# Patient Record
Sex: Female | Born: 1960 | Race: Black or African American | Hispanic: No | Marital: Single | Smoking: Current some day smoker
Health system: Southern US, Community
[De-identification: ages and names within clinical notes are randomized; demographics above are authoritative.]

## PROBLEM LIST (undated history)

## (undated) DIAGNOSIS — M1990 Unspecified osteoarthritis, unspecified site: Secondary | ICD-10-CM

## (undated) DIAGNOSIS — K219 Gastro-esophageal reflux disease without esophagitis: Secondary | ICD-10-CM

## (undated) DIAGNOSIS — I10 Essential (primary) hypertension: Secondary | ICD-10-CM

---

## 1998-08-15 ENCOUNTER — Other Ambulatory Visit: Admission: RE | Admit: 1998-08-15 | Discharge: 1998-08-15 | Payer: Self-pay | Admitting: Obstetrics

## 1998-09-29 ENCOUNTER — Other Ambulatory Visit: Admission: RE | Admit: 1998-09-29 | Discharge: 1998-09-29 | Payer: Self-pay | Admitting: Obstetrics

## 1999-11-06 ENCOUNTER — Emergency Department (HOSPITAL_COMMUNITY): Admission: EM | Admit: 1999-11-06 | Discharge: 1999-11-06 | Payer: Self-pay | Admitting: Emergency Medicine

## 2000-04-16 ENCOUNTER — Other Ambulatory Visit: Admission: RE | Admit: 2000-04-16 | Discharge: 2000-04-16 | Payer: Self-pay | Admitting: Obstetrics

## 2001-05-14 ENCOUNTER — Other Ambulatory Visit: Admission: RE | Admit: 2001-05-14 | Discharge: 2001-05-14 | Payer: Self-pay | Admitting: Obstetrics

## 2003-06-16 ENCOUNTER — Ambulatory Visit (HOSPITAL_COMMUNITY): Admission: RE | Admit: 2003-06-16 | Discharge: 2003-06-16 | Payer: Self-pay | Admitting: Obstetrics

## 2003-06-16 ENCOUNTER — Encounter: Payer: Self-pay | Admitting: Obstetrics

## 2006-09-19 ENCOUNTER — Ambulatory Visit (HOSPITAL_COMMUNITY): Admission: RE | Admit: 2006-09-19 | Discharge: 2006-09-19 | Payer: Self-pay | Admitting: Obstetrics

## 2007-10-24 ENCOUNTER — Ambulatory Visit (HOSPITAL_COMMUNITY): Admission: RE | Admit: 2007-10-24 | Discharge: 2007-10-24 | Payer: Self-pay | Admitting: Obstetrics

## 2010-09-27 ENCOUNTER — Ambulatory Visit (HOSPITAL_COMMUNITY)
Admission: RE | Admit: 2010-09-27 | Discharge: 2010-09-27 | Payer: Self-pay | Source: Home / Self Care | Admitting: Internal Medicine

## 2011-01-07 ENCOUNTER — Encounter: Payer: Self-pay | Admitting: Obstetrics

## 2011-02-17 ENCOUNTER — Emergency Department (HOSPITAL_COMMUNITY)
Admission: EM | Admit: 2011-02-17 | Discharge: 2011-02-17 | Disposition: A | Discharge status: Home / Self Care | Payer: Medicaid Other | Attending: Emergency Medicine | Admitting: Emergency Medicine

## 2011-02-17 DIAGNOSIS — K219 Gastro-esophageal reflux disease without esophagitis: Secondary | ICD-10-CM | POA: Insufficient documentation

## 2011-02-17 DIAGNOSIS — M79609 Pain in unspecified limb: Secondary | ICD-10-CM | POA: Insufficient documentation

## 2011-02-17 DIAGNOSIS — L0201 Cutaneous abscess of face: Secondary | ICD-10-CM | POA: Insufficient documentation

## 2011-02-17 DIAGNOSIS — R229 Localized swelling, mass and lump, unspecified: Secondary | ICD-10-CM | POA: Insufficient documentation

## 2011-02-17 DIAGNOSIS — E7800 Pure hypercholesterolemia, unspecified: Secondary | ICD-10-CM | POA: Insufficient documentation

## 2011-02-17 DIAGNOSIS — I10 Essential (primary) hypertension: Secondary | ICD-10-CM | POA: Insufficient documentation

## 2011-02-17 DIAGNOSIS — L03211 Cellulitis of face: Secondary | ICD-10-CM | POA: Insufficient documentation

## 2011-06-23 ENCOUNTER — Emergency Department (HOSPITAL_COMMUNITY): Payer: Self-pay

## 2011-06-23 ENCOUNTER — Emergency Department (HOSPITAL_COMMUNITY)
Admission: EM | Admit: 2011-06-23 | Discharge: 2011-06-23 | Disposition: A | Discharge status: Home / Self Care | Payer: Self-pay | Attending: Emergency Medicine | Admitting: Emergency Medicine

## 2011-06-23 DIAGNOSIS — R071 Chest pain on breathing: Secondary | ICD-10-CM | POA: Insufficient documentation

## 2011-06-23 DIAGNOSIS — M129 Arthropathy, unspecified: Secondary | ICD-10-CM | POA: Insufficient documentation

## 2011-06-23 DIAGNOSIS — Z79899 Other long term (current) drug therapy: Secondary | ICD-10-CM | POA: Insufficient documentation

## 2011-06-23 DIAGNOSIS — R0602 Shortness of breath: Secondary | ICD-10-CM | POA: Insufficient documentation

## 2011-06-23 DIAGNOSIS — I10 Essential (primary) hypertension: Secondary | ICD-10-CM | POA: Insufficient documentation

## 2011-06-23 DIAGNOSIS — F17200 Nicotine dependence, unspecified, uncomplicated: Secondary | ICD-10-CM | POA: Insufficient documentation

## 2011-06-23 DIAGNOSIS — K219 Gastro-esophageal reflux disease without esophagitis: Secondary | ICD-10-CM | POA: Insufficient documentation

## 2011-06-23 LAB — CBC
HCT: 34.8 % — ABNORMAL LOW (ref 36.0–46.0)
Hemoglobin: 11.8 g/dL — ABNORMAL LOW (ref 12.0–15.0)
MCH: 26.3 pg (ref 26.0–34.0)
MCHC: 33.9 g/dL (ref 30.0–36.0)
MCV: 77.7 fL — ABNORMAL LOW (ref 78.0–100.0)
RBC: 4.48 MIL/uL (ref 3.87–5.11)
RDW: 14.7 % (ref 11.5–15.5)

## 2011-06-23 LAB — URINALYSIS, ROUTINE W REFLEX MICROSCOPIC
Bilirubin Urine: NEGATIVE
Glucose, UA: NEGATIVE mg/dL
Hgb urine dipstick: NEGATIVE
Ketones, ur: NEGATIVE mg/dL
Leukocytes, UA: NEGATIVE
Nitrite: NEGATIVE
Protein, ur: NEGATIVE mg/dL
Specific Gravity, Urine: 1.008 (ref 1.005–1.030)
Urobilinogen, UA: 0.2 mg/dL (ref 0.0–1.0)
pH: 6 (ref 5.0–8.0)

## 2011-06-23 LAB — CK TOTAL AND CKMB (NOT AT ARMC)
CK, MB: 2.2 ng/mL (ref 0.3–4.0)
Relative Index: INVALID (ref 0.0–2.5)
Total CK: 69 U/L (ref 7–177)

## 2011-06-23 LAB — TROPONIN I: Troponin I: <0.3 ng/mL (ref <0.30)

## 2011-06-23 LAB — BASIC METABOLIC PANEL
BUN: 11 mg/dL (ref 6–23)
Calcium: 9.5 mg/dL (ref 8.4–10.5)
Chloride: 100 mEq/L (ref 96–112)
Creatinine, Ser: 0.54 mg/dL (ref 0.50–1.10)
GFR calc Af Amer: >60 mL/min (ref >60)
GFR calc non Af Amer: >60 mL/min (ref >60)
Glucose, Bld: 93 mg/dL (ref 70–99)
Potassium: 3.3 mEq/L — ABNORMAL LOW (ref 3.5–5.1)
Sodium: 135 mEq/L (ref 135–145)

## 2011-06-23 LAB — DIFFERENTIAL
Basophils Absolute: 0 10*3/uL (ref 0.0–0.1)
Basophils Relative: 0 % (ref 0–1)
Eosinophils Relative: 5 % (ref 0–5)
Lymphocytes Relative: 30 % (ref 12–46)
Lymphs Abs: 2.1 10*3/uL (ref 0.7–4.0)
Monocytes Absolute: 0.4 10*3/uL (ref 0.1–1.0)
Monocytes Relative: 6 % (ref 3–12)
Neutro Abs: 4.2 10*3/uL (ref 1.7–7.7)

## 2011-12-13 ENCOUNTER — Emergency Department (HOSPITAL_COMMUNITY)
Admission: EM | Admit: 2011-12-13 | Discharge: 2011-12-13 | Disposition: A | Discharge status: Home / Self Care | Payer: Self-pay | Attending: Emergency Medicine | Admitting: Emergency Medicine

## 2011-12-13 ENCOUNTER — Emergency Department (HOSPITAL_COMMUNITY): Payer: Self-pay

## 2011-12-13 ENCOUNTER — Encounter: Payer: Self-pay | Admitting: *Deleted

## 2011-12-13 DIAGNOSIS — Z79899 Other long term (current) drug therapy: Secondary | ICD-10-CM | POA: Insufficient documentation

## 2011-12-13 DIAGNOSIS — M25469 Effusion, unspecified knee: Secondary | ICD-10-CM | POA: Insufficient documentation

## 2011-12-13 DIAGNOSIS — M1710 Unilateral primary osteoarthritis, unspecified knee: Secondary | ICD-10-CM | POA: Insufficient documentation

## 2011-12-13 DIAGNOSIS — M129 Arthropathy, unspecified: Secondary | ICD-10-CM | POA: Insufficient documentation

## 2011-12-13 DIAGNOSIS — M79609 Pain in unspecified limb: Secondary | ICD-10-CM | POA: Insufficient documentation

## 2011-12-13 DIAGNOSIS — I10 Essential (primary) hypertension: Secondary | ICD-10-CM | POA: Insufficient documentation

## 2011-12-13 DIAGNOSIS — M1990 Unspecified osteoarthritis, unspecified site: Secondary | ICD-10-CM

## 2011-12-13 DIAGNOSIS — M25569 Pain in unspecified knee: Secondary | ICD-10-CM | POA: Insufficient documentation

## 2011-12-13 HISTORY — DX: Essential (primary) hypertension: I10

## 2011-12-13 HISTORY — DX: Unspecified osteoarthritis, unspecified site: M19.90

## 2011-12-13 LAB — D-DIMER, QUANTITATIVE: D-Dimer, Quant: 0.48 ug/mL-FEU (ref 0.00–0.48)

## 2011-12-13 LAB — BASIC METABOLIC PANEL
CO2: 30 mEq/L (ref 19–32)
Calcium: 9.8 mg/dL (ref 8.4–10.5)
Creatinine, Ser: 0.57 mg/dL (ref 0.50–1.10)
GFR calc Af Amer: >90 mL/min (ref >90)
GFR calc non Af Amer: >90 mL/min (ref >90)

## 2011-12-13 MED ORDER — OXYCODONE-ACETAMINOPHEN 5-325 MG PO TABS: 2.0000 | ORAL_TABLET | ORAL | Status: AC | PRN

## 2011-12-13 MED ORDER — OXYCODONE-ACETAMINOPHEN 5-325 MG PO TABS
1.0000 | ORAL_TABLET | Freq: Once | ORAL | Status: AC
  Administered 2011-12-13: 1 via ORAL
  Filled 2011-12-13: qty 1

## 2011-12-13 NOTE — ED Notes (Signed)
Pt educated about discharge instructions. Pt informed about follow-up care and prescribed way for pain medication.  Pt stated that she understands discharge care.

## 2011-12-13 NOTE — ED Notes (Signed)
Pt states that rt leg has been swollen x 1 month. Currently pain is 0. Pulses in BLE 2+. Brisk capillary refill WNL. Will continue to monitor.

## 2011-12-13 NOTE — ED Provider Notes (Signed)
History     CSN: [id number]  Arrival date & time 12/13/11  1532   First MD Initiated Contact with Patient 12/13/11 1806      Chief Complaint  Patient presents with  . Leg Pain    (Consider location/radiation/quality/duration/timing/severity/associated sxs/prior treatment) Patient is a 50 y.o. female presenting with leg pain. The history is provided by the patient.  Leg Pain    the patient is a 50 year old morbidly obese female, who complains of increasing pain and swelling in her right knee for several weeks.  She has not fallen.  She denies fevers.  She denies weakness.  She denies chest pain or shortness of breath.  She has not had recent trips or surgeries.  Past Medical History  Diagnosis Date  . Arthritis   . Hypertension     History reviewed. No pertinent past surgical history.  History reviewed. No pertinent family history.  History  Substance Use Topics  . Smoking status: Passive Smoker    Types: Cigarettes  . Smokeless tobacco: Not on file  . Alcohol Use: No    OB History    Grav Para Term Preterm Abortions TAB SAB Ect Mult Living                  Review of Systems  Constitutional: Negative for fever and chills.  Respiratory: Negative for cough, chest tightness and shortness of breath.   Cardiovascular: Negative for chest pain and palpitations.  Gastrointestinal: Negative for nausea and vomiting.  Musculoskeletal:       Right knee pain, and  Skin: Negative for rash.  Neurological: Negative for weakness.  Psychiatric/Behavioral: Negative for confusion.    Allergies  Review of patient's allergies indicates no known allergies.  Home Medications   Current Outpatient Rx  Name Route Sig Dispense Refill  . GOODY HEADACHE PO Oral Take 1 packet by mouth daily as needed. For headache     . CELECOXIB 200 MG PO CAPS Oral Take 200 mg by mouth daily.      Marland Kitchen HYDROCHLOROTHIAZIDE 25 MG PO TABS Oral Take 25 mg by mouth daily.      Marland Kitchen METOPROLOL SUCCINATE ER 50  MG PO TB24 Oral Take 50 mg by mouth daily.      Marland Kitchen OMEPRAZOLE 40 MG PO CPDR Oral Take 40 mg by mouth daily.        BP 137/100  Pulse 71  Temp(Src) 98.1 F (36.7 C) (Oral)  Resp 19  SpO2 100%  Physical Exam  Constitutional: She is oriented to person, place, and time. She appears well-developed and well-nourished.  HENT:  Head: Normocephalic and atraumatic.  Eyes: Pupils are equal, round, and reactive to light.  Neck: Normal range of motion.  Cardiovascular: Normal rate, regular rhythm and normal heart sounds.   No murmur heard. Pulmonary/Chest: Effort normal and breath sounds normal. No respiratory distress. She has no wheezes. She has no rales.  Abdominal: Soft. She exhibits no distension and no mass. There is no tenderness. There is no rebound and no guarding.  Musculoskeletal: Normal range of motion. She exhibits tenderness. She exhibits no edema.       Right knee Positive medial jointline tenderness.  No swelling.  No effusion.  No rashes.  No joint instability  Neurological: She is alert and oriented to person, place, and time. No cranial nerve deficit.  Skin: Skin is warm and dry. No rash noted. No erythema.  Psychiatric: She has a normal mood and affect. Her behavior is normal.  ED Course  Procedures (including critical care time) 50 year old morbidly obese female, with right knee pain, and tenderness, with no history of trauma, and no systemic symptoms.  No chest pain, or shortness of breath.  We will perform an x-ray, and a d-dimer, to look for blood clot, versus osteoarthritis.  Labs Reviewed  BASIC METABOLIC PANEL - Abnormal; Notable for the following:    Potassium 3.1 (*)    All other components within normal limits  D-DIMER, QUANTITATIVE   Dg Knee Complete 4 Views Right  12/13/2011  *RADIOLOGY REPORT*  Clinical Data: Pain and swelling  RIGHT KNEE - COMPLETE 4+ VIEW  Comparison: None  Findings: There is no joint effusion.  There is moderate tricompartment  osteoarthritis.  Changes include sharpening of the tibial spines, marginal spur formation and joint space narrowing.  No fracture or subluxation identified.  IMPRESSION:  1.  No acute findings.  2.  Moderate tricompartment osteoarthritis.  Original Report Authenticated By: Rosealee Albee, M.D.     No diagnosis found.    MDM  osteoarthritis        Nicholes Stairs, MD 12/13/11 Barry Brunner
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
## 2012-03-10 ENCOUNTER — Emergency Department (HOSPITAL_COMMUNITY)
Admission: EM | Admit: 2012-03-10 | Discharge: 2012-03-10 | Disposition: A | Discharge status: Home / Self Care | Payer: No Typology Code available for payment source | Attending: Emergency Medicine | Admitting: Emergency Medicine

## 2012-03-10 ENCOUNTER — Encounter (HOSPITAL_COMMUNITY): Payer: Self-pay | Admitting: Emergency Medicine

## 2012-03-10 DIAGNOSIS — M129 Arthropathy, unspecified: Secondary | ICD-10-CM | POA: Insufficient documentation

## 2012-03-10 DIAGNOSIS — I10 Essential (primary) hypertension: Secondary | ICD-10-CM | POA: Insufficient documentation

## 2012-03-10 DIAGNOSIS — M549 Dorsalgia, unspecified: Secondary | ICD-10-CM | POA: Insufficient documentation

## 2012-03-10 DIAGNOSIS — F17200 Nicotine dependence, unspecified, uncomplicated: Secondary | ICD-10-CM | POA: Insufficient documentation
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
## 2012-03-12 NOTE — ED Provider Notes (Signed)
Medical screening examination/treatment/procedure(s) were performed by non-physician practitioner and as supervising physician I was immediately available for consultation/collaboration.   Rolla Servidio III, MD 03/12/12 1026 

## 2013-06-29 ENCOUNTER — Encounter (HOSPITAL_COMMUNITY): Payer: Self-pay | Admitting: Emergency Medicine

## 2013-06-29 ENCOUNTER — Emergency Department (HOSPITAL_COMMUNITY)
Admission: EM | Admit: 2013-06-29 | Discharge: 2013-06-29 | Disposition: A | Discharge status: Home / Self Care | Payer: Medicaid Other | Attending: Emergency Medicine | Admitting: Emergency Medicine

## 2013-06-29 ENCOUNTER — Emergency Department (HOSPITAL_COMMUNITY): Payer: Medicaid Other

## 2013-06-29 DIAGNOSIS — I10 Essential (primary) hypertension: Secondary | ICD-10-CM | POA: Insufficient documentation

## 2013-06-29 DIAGNOSIS — M129 Arthropathy, unspecified: Secondary | ICD-10-CM | POA: Insufficient documentation

## 2013-06-29 DIAGNOSIS — R110 Nausea: Secondary | ICD-10-CM | POA: Insufficient documentation

## 2013-06-29 DIAGNOSIS — R1909 Other intra-abdominal and pelvic swelling, mass and lump: Secondary | ICD-10-CM | POA: Insufficient documentation

## 2013-06-29 DIAGNOSIS — R109 Unspecified abdominal pain: Secondary | ICD-10-CM | POA: Insufficient documentation

## 2013-06-29 DIAGNOSIS — Z79899 Other long term (current) drug therapy: Secondary | ICD-10-CM | POA: Insufficient documentation

## 2013-06-29 LAB — CBC
HCT: 33.2 % — ABNORMAL LOW (ref 36.0–46.0)
MCH: 24.9 pg — ABNORMAL LOW (ref 26.0–34.0)
MCV: 76 fL — ABNORMAL LOW (ref 78.0–100.0)
Platelets: 327 10*3/uL (ref 150–400)
RBC: 4.37 MIL/uL (ref 3.87–5.11)
RDW: 15.2 % (ref 11.5–15.5)
WBC: 6.8 10*3/uL (ref 4.0–10.5)

## 2013-06-29 LAB — BASIC METABOLIC PANEL
BUN: 10 mg/dL (ref 6–23)
CO2: 30 mEq/L (ref 19–32)
Calcium: 9.3 mg/dL (ref 8.4–10.5)
Chloride: 97 mEq/L (ref 96–112)
GFR calc Af Amer: >90 mL/min (ref >90)
GFR calc non Af Amer: >90 mL/min (ref >90)
Glucose, Bld: 91 mg/dL (ref 70–99)
Potassium: 3.1 mEq/L — ABNORMAL LOW (ref 3.5–5.1)

## 2013-06-29 MED ORDER — IOHEXOL 300 MG/ML  SOLN
25.0000 mL | INTRAMUSCULAR | Status: AC
  Administered 2013-06-29: 25 mL via ORAL

## 2013-06-29 MED ORDER — IOHEXOL 300 MG/ML  SOLN
100.0000 mL | Freq: Once | INTRAMUSCULAR | Status: AC | PRN
  Administered 2013-06-29: 100 mL via INTRAVENOUS

## 2013-06-29 NOTE — ED Notes (Signed)
Pt. Stated, I've had rt. Side pain for a year. It feels hard when i lay on that side.

## 2013-06-29 NOTE — ED Provider Notes (Signed)
History    CSN: [id number] Arrival date & time 06/29/13  1033  First MD Initiated Contact with Patient 06/29/13 1119     Chief Complaint  Patient presents with  . Abdominal Pain   (Consider location/radiation/quality/duration/timing/severity/associated sxs/prior Treatment) HPI Comments: 52 year old morbidly obese female past medical history of hypertension and arthritis presents to the emergency department complaining of a right sided abdominal mass has been present for the past year causing her discomfort. Patient states whenever she lays on her right side she feels something hard, when she turns to lay on the left side she can feel pressure coming from the right. Feels as if her abdomen has been getting larger and states she does not eat that much. Last menstrual period was 3 years ago and has not had any vaginal bleeding since. No history of hysterectomy. She mentioned this problem to her primary care physician who "mashed on my stomach and said I was fine". Admits to occasional nausea without vomiting. Denies urinary or bowel changes. States her on has a history of cancer, unsure what kind, possibly breast cancer.  Patient is a 52 y.o. female presenting with abdominal pain. The history is provided by the patient.  Abdominal Pain Associated symptoms include abdominal pain and nausea. Pertinent negatives include no chest pain, chills, fever or vomiting.   Past Medical History  Diagnosis Date  . Arthritis   . Hypertension    History reviewed. No pertinent past surgical history. No family history on file. History  Substance Use Topics  . Smoking status: Passive Smoke Exposure - Never Smoker    Types: Cigarettes  . Smokeless tobacco: Not on file  . Alcohol Use: No   OB History   Grav Para Term Preterm Abortions TAB SAB Ect Mult Living                 Review of Systems  Constitutional: Negative for fever, chills and appetite change.  Respiratory: Negative for shortness of  breath.   Cardiovascular: Negative for chest pain.  Gastrointestinal: Positive for nausea and abdominal pain. Negative for vomiting.  Genitourinary: Negative for dysuria, urgency, frequency, vaginal bleeding, vaginal discharge, difficulty urinating, vaginal pain and pelvic pain.  Musculoskeletal: Negative for back pain.  All other systems reviewed and are negative.    Allergies  Review of patient's allergies indicates no known allergies.  Home Medications   Current Outpatient Rx  Name  Route  Sig  Dispense  Refill  . Aspirin-Acetaminophen-Caffeine (GOODY HEADACHE PO)   Oral   Take 1 packet by mouth daily as needed. For headache         . celecoxib (CELEBREX) 200 MG capsule   Oral   Take 200 mg by mouth daily as needed for pain.          . hydrochlorothiazide (HYDRODIURIL) 25 MG tablet   Oral   Take 25 mg by mouth daily.           . metoprolol (TOPROL-XL) 50 MG 24 hr tablet   Oral   Take 50 mg by mouth daily.           Marland Kitchen omeprazole (PRILOSEC) 20 MG capsule   Oral   Take 20-40 mg by mouth 2 (two) times daily. Take 40 mg (two capsules) daily, or 20 mg twice a day.         . sertraline (ZOLOFT) 100 MG tablet   Oral   Take 100 mg by mouth daily.         Marland Kitchen  Vitamin D, Ergocalciferol, (DRISDOL) 50000 UNITS CAPS   Oral   Take 50,000 Units by mouth every 7 (seven) days. Take one capsule every Friday.          BP 148/93  Pulse 87  Temp(Src) 97.6 F (36.4 C) (Oral)  Resp 18  Ht 5' 5.5" (1.664 m)  Wt 270 lb (122.471 kg)  BMI 44.23 kg/m2  SpO2 100% Physical Exam  Nursing note and vitals reviewed. Constitutional: She is oriented to person, place, and time. She appears well-developed. No distress.  Morbidly obese.  HENT:  Head: Normocephalic and atraumatic.  Mouth/Throat: Oropharynx is clear and moist.  Eyes: Conjunctivae are normal. No scleral icterus.  Neck: Normal range of motion. Neck supple.  Cardiovascular: Normal rate, regular rhythm and normal  heart sounds.   Pulmonary/Chest: Effort normal and breath sounds normal. No respiratory distress.  Abdominal: Soft. Bowel sounds are normal. There is generalized tenderness (generalized abdominal tenderness, R>L). There is no rigidity, no rebound and no guarding.  Abdominal exam limited by patient's body habitus. No masses palpated. No peritoneal signs.  Musculoskeletal: Normal range of motion. She exhibits no edema.  Neurological: She is alert and oriented to person, place, and time.  Skin: Skin is warm and dry. She is not diaphoretic.  Psychiatric: She has a normal mood and affect. Her behavior is normal.    ED Course  Procedures (including critical care time) Labs Reviewed  CBC - Abnormal; Notable for the following:    Hemoglobin 10.9 (*)    HCT 33.2 (*)    MCV 76.0 (*)    MCH 24.9 (*)    All other components within normal limits  BASIC METABOLIC PANEL - Abnormal; Notable for the following:    Potassium 3.1 (*)    All other components within normal limits   Ct Abdomen Pelvis W Contrast  06/29/2013   *RADIOLOGY REPORT*  Clinical Data: Abdominal pain with bloating for 1-2 years.  CT ABDOMEN AND PELVIS WITH CONTRAST  Technique:  Multidetector CT imaging of the abdomen and pelvis was performed following the standard protocol during bolus administration of intravenous contrast.  Contrast: OMNIPAQUE IOHEXOL 300 MG/ML  SOLN  Comparison: None.  Findings: The lung bases are clear.  There is no pleural effusion.  The liver, gallbladder, biliary system and pancreas appear normal. The spleen, adrenal glands and kidneys appear normal.  The stomach and small bowel appear normal.  There is moderate stool throughout the colon.  Retrocecal appendix is distended to 11 mm, but shows no gross surrounding inflammatory change or focal fluid collection.  There are small adjacent ileocolonic mesenteric lymph nodes.  No enlarged retroperitoneal or mesenteric lymph nodes are identified.  There is no ascites.   The bladder, uterus and ovaries appear normal.  There is no significant atherosclerosis.  Mild degenerative changes are present within the lumbar spine, right hip and symphysis pubis.  There are no acute osseous findings.  IMPRESSION:  1.  Prominence of the appendix is of uncertain significance/etiology in this patient with reported longstanding pain and no leukocytosis.  Clinical correlation necessary to exclude atypical appendicitis. 2.  No evidence of bowel obstruction or abscess. 3.  No significant visceral organ findings.   Original Report Authenticated By: Carey Bullocks, M.D.   1. Abdominal pain     MDM  Patient with right sided "mass" and discomfort. PE limited by patient's body habitus, generalized tenderness of abdomen, moreso R. Will obtain CT abd/pelvis to r/o any masses causing her pain. Still  with uterus/ovaries. She is in NAD. Evaluated by PCP and he did not look into the issue. Case discussed with Dr. Hyacinth Meeker who also evaluated patient and agrees with plan of care. 3:00 PM CT scan without any abnormality. No findings concerning patient's symptoms. She will f/u with PCP. Tylenol/motrin for pain. Return precautions discussed. Patient states understanding of plan and is agreeable.   Trevor Mace, PA-C 06/29/13 1501

## 2013-06-29 NOTE — ED Provider Notes (Signed)
53 year old obese female with 9-12 months of right-sided abdominal pain and right flank pain. This has been getting worse recently, she was evaluated by her physician on and ordered at this point as the patient has not been nauseated or vomiting, no changes in her stool habits, no weight loss. On my exam she is morbidly obese, she has reproducible tenderness to palpation across her right side and flank as well as right upper quadrant.  CT scan shows no acute findings, unremarkable workup, patient stable for discharge.  Medical screening examination/treatment/procedure(s) were conducted as a shared visit with non-physician practitioner(s) and myself.  I personally evaluated the patient during the encounter    Vida Roller, MD 06/29/13 2010

## 2013-06-29 NOTE — ED Notes (Signed)
Patient transported to CT 

## 2013-06-29 NOTE — ED Notes (Signed)
Pt reports she feels "a knot" to right upper abdomen x 1 year. States notices it when lying on side. Pain to palpate. Saw PCP last week, states he did not think much of it. Nausea at times, no vomiting. No other symptoms. Denies pain at present.

## 2013-08-05 ENCOUNTER — Other Ambulatory Visit (HOSPITAL_COMMUNITY): Payer: Self-pay | Admitting: Obstetrics

## 2013-08-05 DIAGNOSIS — Z1231 Encounter for screening mammogram for malignant neoplasm of breast: Secondary | ICD-10-CM

## 2013-08-14 ENCOUNTER — Ambulatory Visit (HOSPITAL_COMMUNITY)
Admission: RE | Admit: 2013-08-14 | Discharge: 2013-08-14 | Disposition: A | Discharge status: Home / Self Care | Payer: Medicaid Other | Source: Ambulatory Visit | Attending: Obstetrics | Admitting: Obstetrics

## 2013-08-14 ENCOUNTER — Ambulatory Visit (HOSPITAL_COMMUNITY): Payer: No Typology Code available for payment source

## 2013-08-14 DIAGNOSIS — Z1231 Encounter for screening mammogram for malignant neoplasm of breast: Secondary | ICD-10-CM | POA: Insufficient documentation

## 2013-12-30 ENCOUNTER — Encounter (HOSPITAL_COMMUNITY): Payer: Self-pay | Admitting: Emergency Medicine

## 2013-12-30 ENCOUNTER — Emergency Department (HOSPITAL_COMMUNITY)
Admission: EM | Admit: 2013-12-30 | Discharge: 2013-12-30 | Disposition: A | Discharge status: Home / Self Care | Payer: Medicaid Other | Source: Home / Self Care | Attending: Emergency Medicine | Admitting: Emergency Medicine

## 2013-12-30 DIAGNOSIS — K620 Anal polyp: Secondary | ICD-10-CM

## 2013-12-30 DIAGNOSIS — K921 Melena: Secondary | ICD-10-CM

## 2013-12-30 DIAGNOSIS — K621 Rectal polyp: Secondary | ICD-10-CM

## 2013-12-30 DIAGNOSIS — E876 Hypokalemia: Secondary | ICD-10-CM

## 2013-12-30 LAB — POCT I-STAT, CHEM 8
BUN: 8 mg/dL (ref 6–23)
CREATININE: 0.7 mg/dL (ref 0.50–1.10)
Calcium, Ion: 1.27 mmol/L — ABNORMAL HIGH (ref 1.12–1.23)
Chloride: 96 mEq/L (ref 96–112)
GLUCOSE: 97 mg/dL (ref 70–99)
HCT: 38 % (ref 36.0–46.0)
HEMOGLOBIN: 12.9 g/dL (ref 12.0–15.0)
Potassium: 3.3 mEq/L — ABNORMAL LOW (ref 3.7–5.3)
Sodium: 140 mEq/L (ref 137–147)
TCO2: 30 mmol/L (ref 0–100)

## 2013-12-30 NOTE — ED Provider Notes (Signed)
Chief Complaint:   Chief Complaint  Patient presents with  . Rectal Bleeding    History of Present Illness:    Drue NovelDarlena Macdonald is a 53 year old female who has had a 7-8 day history of bright red rectal bleeding with a bowel movement. She notes blood on the toilet tissue and in the toilet bowl. The blood is not mixed with the stool or coating the stool. She denies any constipation or diarrhea. There is no rectal pain or itching. She's had some lower abdominal pain. She's felt a little lightheaded and weak. She's never had a colonoscopy.  Review of Systems:  Other than noted above, the patient denies any of the following symptoms: Constitutional:  No fever, chills, fatigue, weight loss or anorexia. Lungs:  No cough or shortness of breath. Heart:  No chest pain, palpitations, syncope or edema.  No cardiac history. Abdomen:  No nausea, vomiting, hematememesis, melena, constipation, or diarrhea. GU:  No dysuria, frequency, urgency, or hematuria.   Skin:  No rash or itching.  PMFSH:  Past medical history, family history, social history, meds, and allergies were reviewed along with nurse's notes.  No prior abdominal surgeries or history of GI problems.  No use of NSAIDs or aspirin.  No excessive  alcohol intake.  She has hypertension, arthritis, and GERD. She takes Celebrex, omeprazole, and metoprolol.  Physical Exam:   Vital signs:  BP 140/82  Pulse 73  Temp(Src) 97.7 F (36.5 C) (Oral)  Resp 16  SpO2 100% Gen:  Alert, oriented, in no distress. Lungs:  Breath sounds clear and equal bilaterally.  No wheezes, rales or rhonchi. Heart:  Regular rhythm.  No gallops or murmers.   Abdomen:  Soft, nontender, no organomegaly or masses, bowel sounds are normal. Skin:  Clear, warm and dry.  No rash.  Procedure Note:  Verbal informed consent was obtained from the patient.  Risks and benefits were outlined with the patient.  Patient understands and accepts these risks.  Identity of the patient was  confirmed verbally and by armband.    Procedure was performed as followed:  External exam reveals a small anal polyp protruding externally. This was friable and bled when touched. It was nontender to palpation. Anoscopic examination did not reveal any other anal lesions, hemorrhoids, or fissures. The polyp was approximately 1 cm in size. Digital rectal exam reveals no masses, no tenderness, and heme positive stool.  Patient tolerated the procedure well without any immediate complications.  Labs:   Results for orders placed during the hospital encounter of 12/30/13  POCT I-STAT, CHEM 8      Result Value Range   Sodium 140  137 - 147 mEq/L   Potassium 3.3 (*) 3.7 - 5.3 mEq/L   Chloride 96  96 - 112 mEq/L   BUN 8  6 - 23 mg/dL   Creatinine, Ser 1.610.70  0.50 - 1.10 mg/dL   Glucose, Bld 97  70 - 99 mg/dL   Calcium, Ion 0.961.27 (*) 1.12 - 1.23 mmol/L   TCO2 30  0 - 100 mmol/L   Hemoglobin 12.9  12.0 - 15.0 g/dL   HCT 04.538.0  40.936.0 - 81.146.0 %     Assessment:  The primary encounter diagnosis was Rectal polyp. Diagnoses of Hematochezia and Hypokalemia were also pertinent to this visit.  She'll need a followup with gastroenterology as soon as possible to have this removed.  Plan:   1.  Meds:  The following meds were prescribed:   Discharge Medication List as of  12/30/2013  4:26 PM      2.  Patient Education/Counseling:  The patient was given appropriate handouts, self care instructions, and instructed in symptomatic relief.  Advised sitz baths, stool softeners, use of baby wipes for cleansing.  3.  Follow up:  The patient was told to follow up if no better in 3 to 4 days, if becoming worse in any way, and given some red flag symptoms such as massive bleeding or worsening abdominal pain which would prompt immediate return.  Follow up with Dr. Laban Emperor as soon as possible.      Reuben Likes, MD 12/30/13 2049

## 2013-12-30 NOTE — ED Notes (Signed)
At bedside for rectal exam by dr Lorenz Coasterkeller

## 2013-12-30 NOTE — ED Notes (Signed)
Blood in stool, visible for 7 days per patient.  Reports bright blood in stool.  Reports hard stool sometimes.  Reports blood has been thicker some days than other days.  Blood occurs with bowel movements.

## 2013-12-30 NOTE — Discharge Instructions (Signed)
Use stool softener (Docusate 100 mg daily), take a sit down tub bath daily, use baby wipes for cleansing.      Potassium Content of Foods Potassium is a mineral found in many foods and drinks. It helps keep fluids and minerals balanced in your body and also affects how steadily your heart beats. The body needs potassium to control blood pressure and to keep the muscles and nervous system healthy. However, certain health conditions and medicine may require you to eat more or less potassium-rich foods and drinks. Your caregiver or dietitian will tell you how much potassium you should have each day. COMMON SERVING SIZES The list below tells you how big or small common portion sizes are:  1 oz.........4 stacked dice.  3 oz........Marland KitchenDeck of cards.  1 tsp.......Marland KitchenTip of little finger.  1 tbsp....Marland KitchenMarland KitchenThumb.  2 tbsp....Marland KitchenMarland KitchenGolf ball.   c..........Marland KitchenHalf of a fist.  1 c...........Marland KitchenA fist. FOODS AND DRINKS HIGH IN POTASSIUM More than 200 mg of potassium per serving. A serving size is  c (120 mL or noted gram weight) unless otherwise stated. While all the items on this list are high in potassium, some items are higher in potassium than others. Fruits  Apricots (sliced), 83 g.  Apricots (dried halves), 3 oz / 24 g.  Avocado (cubed),  c / 50 g.  Banana (sliced), 75 g.  Cantaloupe (cubed), 80 g.  Dates (pitted), 5 whole / 35 g.  Figs (dried), 4 whole / 32 g.  Guava, c / 55 g.  Honeydew, 1 wedge / 85 g.  Kiwi (sliced), 90 g.  Nectarine, 1 small / 129 g.  Orange, 1 medium / 131 g.  Orange juice.  Pomegranate seeds, 87 g.  Pomegranate juice.  Prunes (pitted), 3 whole / 30 g.  Prune juice, 3 oz / 90 mL.  Seedless raisins, 3 tbsp / 27 g. Vegetables  Artichoke,  of a medium / 64 g.  Asparagus (boiled), 90 g.  Baked beans,  c / 63 g.  Bamboo shoots,  c / 38 g.  Beets (cooked slices), 85 g.  Broccoli (boiled), 78 g.  Brussels sprout (boiled), 78 g.  Butternut  squash (baked), 103 g.  Chickpea (cooked), 82 g.  Green peas (cooked), 80 g.  Hubbard squash (baked cubes),  c / 68 g.  Kidney beans (cooked), 5 tbsp / 55 g.  Lima beans (cooked),  c / 43 g.  Navy beans (cooked),  c / 61 g.  Potato (baked), 61 g.  Potato (boiled), 78 g.  Pumpkin (boiled), 123 g.  Refried beans,  c / 79 g.  Spinach (cooked),  c / 45 g.  Split peas (cooked),  c / 65 g.  Sun-dried tomatoes, 2 tbsp / 7 g.  Sweet potato (baked),  c / 50 g.  Tomato (chopped or sliced), 90 g.  Tomato juice.  Tomato paste, 4 tsp / 21 g.  Tomato sauce,  c / 61 g.  Vegetable juice.  White mushrooms (cooked), 78 g.  Yam (cooked or baked),  c / 34 g.  Zucchini squash (boiled), 90 g. Other Foods and Drinks  Almonds (whole),  c / 36 g.  Cashews (oil roasted),  c / 32 g.  Chocolate milk.  Chocolate pudding, 142 g.  Clams (steamed), 1.5 oz / 43 g.  Dark chocolate, 1.5 oz / 42 g.  Fish, 3 oz / 85 g.  King crab (steamed), 3 oz / 85 g.  Lobster (steamed), 4 oz / 113 g.  Milk (skim, 1%, 2%, whole), 1 c / 240 mL.  Milk chocolate, 2.3 oz / 66 g.  Milk shake.  Nonfat fruit variety yogurt, 123 g.  Peanuts (oil roasted), 1 oz / 28 g.  Peanut butter, 2 tbsp / 32 g.  Pistachio nuts, 1 oz / 28 g.  Pumpkin seeds, 1 oz / 28 g.  Red meat (broiled, cooked, grilled), 3 oz / 85 g.  Scallops (steamed), 3 oz / 85 g.  Shredded wheat cereal (dry), 3 oblong biscuits / 75 g.  Spaghetti sauce,  c / 66 g.  Sunflower seeds (dry roasted), 1 oz / 28 g.  Veggie burger, 1 patty / 70 g. FOODS MODERATE IN POTASSIUM Between 150 mg and 200 mg per serving. A serving is  c (120 mL or noted gram weight) unless otherwise stated. Fruits  Grapefruit,  of the fruit / 123 g.  Grapefruit juice.  Pineapple juice.  Plums (sliced), 83 g.  Tangerine, 1 large / 120 g. Vegetables  Carrots (boiled), 78 g.  Carrots (sliced), 61 g.  Rhubarb (cooked with sugar), 120  g.  Rutabaga (cooked), 120 g.  Sweet corn (cooked), 75 g.  Yellow snap beans (cooked), 63 g. Other Foods and Drinks   Bagel, 1 bagel / 98 g.  Chicken breast (roasted and chopped),  c / 70 g.  Chocolate ice cream / 66 g.  Pita bread, 1 large / 64 g.  Shrimp (steamed), 4 oz / 113 g.  Swiss cheese (diced), 70 g.  Vanilla ice cream, 66 g.  Vanilla pudding, 140 g. FOODS LOW IN POTASSIUM Less than 150 mg per serving. A serving size is  cup (120 mL or noted gram weight) unless otherwise stated. If you eat more than 1 serving of a food low in potassium, the food may be considered a food high in potassium. Fruits  Apple (slices), 55 g.  Apple juice.  Applesauce, 122 g.  Blackberries, 72 g.  Blueberries, 74 g.  Cranberries, 50 g.  Cranberry juice.  Fruit cocktail, 119 g.  Fruit punch.  Grapes, 46 g.  Grape juice.  Mandarin oranges (canned), 126 g.  Peach (slices), 77 g.  Pineapple (chunks), 83 g.  Raspberries, 62 g.  Red cherries (without pits), 78 g.  Strawberries (sliced), 83 g.  Watermelon (diced), 76 g. Vegetables  Alfalfa sprouts, 17 g.  Bell peppers (sliced), 46 g.  Cabbage (shredded), 35 g.  Cauliflower (boiled), 62 g.  Celery, 51 g.  Collard greens (boiled), 95 g.  Cucumber (sliced), 52 g.  Eggplant (cubed), 41 g.  Green beans (boiled), 63 g.  Lettuce (shredded), 1 c / 36 g.  Onions (sauteed), 44 g.  Radishes (sliced), 58 g.  Spaghetti squash, 51 g. Other Foods and Drinks  [employer] food cake, 1 slice / 28 g.  Black tea.  Brown rice (cooked), 98 g.  Butter croissant, 1 medium / 57 g.  Carbonated soda.  Coffee.  Cheddar cheese (diced), 66 g.  Corn flake cereal (dry), 14 g.  Cottage cheese, 118 g.  Cream of rice cereal (cooked), 122 g.  Cream of wheat cereal (cooked), 126 g.  Crisped rice cereal (dry), 14 g.  Egg (boiled, fried, poached, omelet, scrambled), 1 large / 46 61 g.  English muffin, 1 muffin / 57  g.  Frozen ice pop, 1 pop / 55 g.  Graham cracker, 1 large rectangular cracker / 14 g.  Jelly beans, 112 g.  Non-dairy whipped topping.  Oatmeal, 88 g.  Orange sherbet, 74 g.  Puffed rice cereal (dry), 7 g.  Pasta (cooked), 70 g.  Rice cakes, 4 cakes / 36 g.  Sugared doughnut, 4 oz / 116 g.  White bread, 1 slice / 30 g.  White rice (cooked), 79 93 g.  Wild rice (cooked), 82 g.  Yellow cake, 1 slice / 68 g. Document Released: 07/17/2005 Document Revised: 11/19/2012 Document Reviewed: 04/18/2012 [facility] Patient Information 2014 [place], [place].

## 2013-12-31 LAB — OCCULT BLOOD, POC DEVICE: Fecal Occult Bld: POSITIVE — AB

## 2014-02-12 ENCOUNTER — Emergency Department (HOSPITAL_COMMUNITY): Payer: Medicaid Other

## 2014-02-12 ENCOUNTER — Emergency Department (HOSPITAL_COMMUNITY)
Admission: EM | Admit: 2014-02-12 | Discharge: 2014-02-13 | Disposition: A | Discharge status: Home / Self Care | Payer: Medicaid Other | Attending: Emergency Medicine | Admitting: Emergency Medicine

## 2014-02-12 ENCOUNTER — Encounter (HOSPITAL_COMMUNITY): Payer: Self-pay | Admitting: Emergency Medicine

## 2014-02-12 DIAGNOSIS — R112 Nausea with vomiting, unspecified: Secondary | ICD-10-CM | POA: Insufficient documentation

## 2014-02-12 DIAGNOSIS — Z8719 Personal history of other diseases of the digestive system: Secondary | ICD-10-CM | POA: Insufficient documentation

## 2014-02-12 DIAGNOSIS — R109 Unspecified abdominal pain: Secondary | ICD-10-CM | POA: Insufficient documentation

## 2014-02-12 DIAGNOSIS — R197 Diarrhea, unspecified: Secondary | ICD-10-CM | POA: Insufficient documentation

## 2014-02-12 DIAGNOSIS — M129 Arthropathy, unspecified: Secondary | ICD-10-CM | POA: Insufficient documentation

## 2014-02-12 DIAGNOSIS — E876 Hypokalemia: Secondary | ICD-10-CM

## 2014-02-12 DIAGNOSIS — E871 Hypo-osmolality and hyponatremia: Secondary | ICD-10-CM | POA: Insufficient documentation

## 2014-02-12 DIAGNOSIS — Z79899 Other long term (current) drug therapy: Secondary | ICD-10-CM | POA: Insufficient documentation

## 2014-02-12 DIAGNOSIS — I10 Essential (primary) hypertension: Secondary | ICD-10-CM | POA: Insufficient documentation

## 2014-02-12 LAB — CBC WITH DIFFERENTIAL/PLATELET
BASOS ABS: 0 10*3/uL (ref 0.0–0.1)
Basophils Relative: 0 % (ref 0–1)
EOS ABS: 0 10*3/uL (ref 0.0–0.7)
EOS PCT: 0 % (ref 0–5)
HCT: 35.1 % — ABNORMAL LOW (ref 36.0–46.0)
Hemoglobin: 11.4 g/dL — ABNORMAL LOW (ref 12.0–15.0)
LYMPHS ABS: 1.5 10*3/uL (ref 0.7–4.0)
Lymphocytes Relative: 28 % (ref 12–46)
MCH: 24.4 pg — AB (ref 26.0–34.0)
MCHC: 32.5 g/dL (ref 30.0–36.0)
MCV: 75 fL — AB (ref 78.0–100.0)
Monocytes Absolute: 0.4 10*3/uL (ref 0.1–1.0)
Monocytes Relative: 9 % (ref 3–12)
Neutro Abs: 3.2 10*3/uL (ref 1.7–7.7)
Neutrophils Relative %: 63 % (ref 43–77)
PLATELETS: 305 10*3/uL (ref 150–400)
RBC: 4.68 MIL/uL (ref 3.87–5.11)
RDW: 14.9 % (ref 11.5–15.5)
WBC: 5.1 10*3/uL (ref 4.0–10.5)

## 2014-02-12 LAB — COMPREHENSIVE METABOLIC PANEL
ALT: 20 U/L (ref 0–35)
AST: 26 U/L (ref 0–37)
Albumin: 3.7 g/dL (ref 3.5–5.2)
Alkaline Phosphatase: 106 U/L (ref 39–117)
BUN: 10 mg/dL (ref 6–23)
CALCIUM: 9.6 mg/dL (ref 8.4–10.5)
CO2: 26 mEq/L (ref 19–32)
Chloride: 89 mEq/L — ABNORMAL LOW (ref 96–112)
Creatinine, Ser: 0.67 mg/dL (ref 0.50–1.10)
GFR calc non Af Amer: >90 mL/min (ref >90)
GLUCOSE: 80 mg/dL (ref 70–99)
Potassium: 3 mEq/L — ABNORMAL LOW (ref 3.7–5.3)
SODIUM: 130 meq/L — AB (ref 137–147)
TOTAL PROTEIN: 8.5 g/dL — AB (ref 6.0–8.3)
Total Bilirubin: 0.4 mg/dL (ref 0.3–1.2)

## 2014-02-12 LAB — LIPASE, BLOOD: Lipase: 21 U/L (ref 11–59)

## 2014-02-12 LAB — URINALYSIS, ROUTINE W REFLEX MICROSCOPIC
Bilirubin Urine: NEGATIVE
Glucose, UA: NEGATIVE mg/dL
HGB URINE DIPSTICK: NEGATIVE
Ketones, ur: NEGATIVE mg/dL
LEUKOCYTES UA: NEGATIVE
NITRITE: NEGATIVE
Protein, ur: NEGATIVE mg/dL
SPECIFIC GRAVITY, URINE: 1.009 (ref 1.005–1.030)
UROBILINOGEN UA: 0.2 mg/dL (ref 0.0–1.0)
pH: 6.5 (ref 5.0–8.0)

## 2014-02-12 MED ORDER — MORPHINE SULFATE 4 MG/ML IJ SOLN
4.0000 mg | Freq: Once | INTRAMUSCULAR | Status: AC
  Administered 2014-02-12: 4 mg via INTRAVENOUS
  Filled 2014-02-12: qty 1

## 2014-02-12 MED ORDER — SODIUM CHLORIDE 0.9 % IV SOLN
1000.0000 mL | Freq: Once | INTRAVENOUS | Status: AC
  Administered 2014-02-12: 1000 mL via INTRAVENOUS

## 2014-02-12 MED ORDER — SODIUM CHLORIDE 0.9 % IV SOLN: 1000.0000 mL | INTRAVENOUS | Status: DC

## 2014-02-12 MED ORDER — ONDANSETRON 8 MG PO TBDP
8.0000 mg | ORAL_TABLET | Freq: Once | ORAL | Status: AC
  Administered 2014-02-12: 8 mg via ORAL
  Filled 2014-02-12: qty 1

## 2014-02-12 MED ORDER — ONDANSETRON HCL 4 MG/2ML IJ SOLN
4.0000 mg | Freq: Once | INTRAMUSCULAR | Status: AC
  Administered 2014-02-12: 4 mg via INTRAVENOUS
  Filled 2014-02-12: qty 2

## 2014-02-12 MED ORDER — IOHEXOL 300 MG/ML  SOLN
50.0000 mL | Freq: Once | INTRAMUSCULAR | Status: AC | PRN
  Administered 2014-02-12: 50 mL via ORAL

## 2014-02-12 MED ORDER — POTASSIUM CHLORIDE 10 MEQ/100ML IV SOLN
10.0000 meq | Freq: Once | INTRAVENOUS | Status: AC
  Administered 2014-02-12: 10 meq via INTRAVENOUS
  Filled 2014-02-12: qty 100

## 2014-02-12 NOTE — ED Notes (Signed)
Pt reports centralized abdominal pain that began on Monday. Pt reports nausea, emesis, and diarrhea since Monday. Pt denies abdominal surgeries or history. Pt is A/O x4, in NAD, and vitals are WDL.

## 2014-02-12 NOTE — ED Provider Notes (Signed)
CSN: [id number]     Arrival date & time 02/12/14  1804 History   First MD Initiated Contact with Patient 02/12/14 2251     Chief Complaint  Patient presents with  . Abdominal Pain  . Emesis  . Diarrhea     (Consider location/radiation/quality/duration/timing/severity/associated sxs/prior Treatment) Patient is a 53 y.o. female presenting with abdominal pain, vomiting, and diarrhea. The history is provided by the patient.  Abdominal Pain Associated symptoms: diarrhea and vomiting   Emesis Associated symptoms: abdominal pain and diarrhea   Diarrhea Associated symptoms: abdominal pain and vomiting   She has been sick for the past 5 days with crampy abdominal pain, nausea, vomiting, diarrhea. Symptoms are worse when she tries to eat. Abdominal pain is improved slightly following vomiting but recurs quickly. Pain is severe and she rates it 10/10. There has been subjective fever but no chills or sweats. She denies any blood in stool or emesis. She has not tried anything to treat her symptoms. She denies sick contacts. Of note, she was diagnosed with a rectal polyp at an urgent care visit last month and is supposed to be scheduled for a colonoscopy but has not actually been scheduled.  Past Medical History  Diagnosis Date  . Arthritis   . Hypertension    History reviewed. No pertinent past surgical history. No family history on file. History  Substance Use Topics  . Smoking status: Passive Smoke Exposure - Never Smoker    Types: Cigarettes  . Smokeless tobacco: Not on file  . Alcohol Use: No   OB History   Grav Para Term Preterm Abortions TAB SAB Ect Mult Living                 Review of Systems  Gastrointestinal: Positive for vomiting, abdominal pain and diarrhea.  All other systems reviewed and are negative.      Allergies  Review of patient's allergies indicates no known allergies.  Home Medications   Current Outpatient Rx  Name  Route  Sig  Dispense  Refill  .  celecoxib (CELEBREX) 200 MG capsule   Oral   Take 200 mg by mouth daily as needed for mild pain.          . hydrochlorothiazide (HYDRODIURIL) 25 MG tablet   Oral   Take 25 mg by mouth every evening.          Marland Kitchen HYDROcodone-acetaminophen (NORCO/VICODIN) 5-325 MG per tablet   Oral   Take 1 tablet by mouth every 6 (six) hours as needed for moderate pain.         . metoprolol (TOPROL-XL) 50 MG 24 hr tablet   Oral   Take 50 mg by mouth every evening.          Marland Kitchen omeprazole (PRILOSEC) 20 MG capsule   Oral   Take 20 mg by mouth 2 (two) times daily.          . sertraline (ZOLOFT) 100 MG tablet   Oral   Take 100 mg by mouth every evening.           BP 147/79  Pulse 77  Temp(Src) 98.1 F (36.7 C) (Oral)  Resp 18  SpO2 99% Physical Exam  Nursing note and vitals reviewed.  53 year old female, resting comfortably and in no acute distress. Vital signs are significant for hypertension with blood pressure 147/79. Oxygen saturation is 99%, which is normal. Head is normocephalic and atraumatic. PERRLA, EOMI. Oropharynx is clear. Neck is nontender and supple  without adenopathy or JVD. Back is nontender and there is no CVA tenderness. Lungs are clear without rales, wheezes, or rhonchi. Chest is nontender. Heart has regular rate and rhythm without murmur. Abdomen is soft, flat, with mild tenderness diffusely. There is no rebound or guarding. There are no masses or hepatosplenomegaly and peristalsis is hypoactive. Extremities have no cyanosis or edema, full range of motion is present. Skin is warm and dry without rash. Neurologic: Mental status is normal, cranial nerves are intact, there are no motor or sensory deficits.  ED Course  Procedures (including critical care time) Labs Review Results for orders placed during the hospital encounter of 02/12/14  CBC WITH DIFFERENTIAL      Result Value Ref Range   WBC 5.1  4.0 - 10.5 K/uL   RBC 4.68  3.87 - 5.11 MIL/uL   Hemoglobin  11.4 (*) 12.0 - 15.0 g/dL   HCT 40.9 (*) 81.1 - 91.4 %   MCV 75.0 (*) 78.0 - 100.0 fL   MCH 24.4 (*) 26.0 - 34.0 pg   MCHC 32.5  30.0 - 36.0 g/dL   RDW 78.2  95.6 - 21.3 %   Platelets 305  150 - 400 K/uL   Neutrophils Relative % 63  43 - 77 %   Neutro Abs 3.2  1.7 - 7.7 K/uL   Lymphocytes Relative 28  12 - 46 %   Lymphs Abs 1.5  0.7 - 4.0 K/uL   Monocytes Relative 9  3 - 12 %   Monocytes Absolute 0.4  0.1 - 1.0 K/uL   Eosinophils Relative 0  0 - 5 %   Eosinophils Absolute 0.0  0.0 - 0.7 K/uL   Basophils Relative 0  0 - 1 %   Basophils Absolute 0.0  0.0 - 0.1 K/uL  COMPREHENSIVE METABOLIC PANEL      Result Value Ref Range   Sodium 130 (*) 137 - 147 mEq/L   Potassium 3.0 (*) 3.7 - 5.3 mEq/L   Chloride 89 (*) 96 - 112 mEq/L   CO2 26  19 - 32 mEq/L   Glucose, Bld 80  70 - 99 mg/dL   BUN 10  6 - 23 mg/dL   Creatinine, Ser 0.86  0.50 - 1.10 mg/dL   Calcium 9.6  8.4 - 57.8 mg/dL   Total Protein 8.5 (*) 6.0 - 8.3 g/dL   Albumin 3.7  3.5 - 5.2 g/dL   AST 26  0 - 37 U/L   ALT 20  0 - 35 U/L   Alkaline Phosphatase 106  39 - 117 U/L   Total Bilirubin 0.4  0.3 - 1.2 mg/dL   GFR calc non Af Amer >90  >90 mL/min   GFR calc Af Amer >90  >90 mL/min  LIPASE, BLOOD      Result Value Ref Range   Lipase 21  11 - 59 U/L  URINALYSIS, ROUTINE W REFLEX MICROSCOPIC      Result Value Ref Range   Color, Urine YELLOW  YELLOW   APPearance CLEAR  CLEAR   Specific Gravity, Urine 1.009  1.005 - 1.030   pH 6.5  5.0 - 8.0   Glucose, UA NEGATIVE  NEGATIVE mg/dL   Hgb urine dipstick NEGATIVE  NEGATIVE   Bilirubin Urine NEGATIVE  NEGATIVE   Ketones, ur NEGATIVE  NEGATIVE mg/dL   Protein, ur NEGATIVE  NEGATIVE mg/dL   Urobilinogen, UA 0.2  0.0 - 1.0 mg/dL   Nitrite NEGATIVE  NEGATIVE   Leukocytes, UA NEGATIVE  NEGATIVE   Imaging Review Ct Abdomen Pelvis W Contrast  02/13/2014   CLINICAL DATA:  Mid abdominal pain, nausea, vomiting and diarrhea.  EXAM: CT ABDOMEN AND PELVIS WITH CONTRAST  TECHNIQUE:  Multidetector CT imaging of the abdomen and pelvis was performed using the standard protocol following bolus administration of intravenous contrast.  CONTRAST:  50mL OMNIPAQUE IOHEXOL 300 MG/ML SOLN, 100mL OMNIPAQUE IOHEXOL 300 MG/ML SOLN  COMPARISON:  CT of the abdomen and pelvis performed 06/29/2013  FINDINGS: The visualized lung bases are clear.  The liver and spleen are unremarkable in appearance. The gallbladder is within normal limits. The pancreas and adrenal glands are unremarkable.  The kidneys are unremarkable in appearance. There is no evidence of hydronephrosis. No renal or ureteral stones are seen. No perinephric stranding is appreciated.  No free fluid is identified. The small bowel is unremarkable in appearance. The stomach is within normal limits. No acute vascular abnormalities are seen.  The appendix is grossly unremarkable in appearance, though difficult to fully assess. There is no definite evidence of appendicitis. Mild diverticulosis is noted about the cecum, and additional mild diverticulosis is seen along the distal descending and sigmoid colon. There is no definite evidence of diverticulitis.  The bladder is mildly distended and grossly unremarkable in appearance. The uterus is within normal limits. The ovaries are relatively symmetric; no suspicious adnexal masses are seen. No inguinal lymphadenopathy is seen.  No acute osseous abnormalities are identified.  IMPRESSION: 1. No acute abnormality seen within the abdomen or pelvis. 2. Mild diverticulosis about the cecum and along the distal descending and sigmoid colon, without evidence of diverticulitis.   Electronically Signed   By: Roanna RaiderJeffery  Chang M.D.   On: 02/13/2014 01:21    MDM   Final diagnoses:  Nausea vomiting and diarrhea  Hypokalemia  Hyponatremia    Vomiting and diarrhea of crampy abdominal pain. Pattern seems most consistent with a viral gastroenteritis. Normal WBC is comforting that symptoms have lasted than would  normally be expected. She will be sent for CT scan and to be given IV fluids as well as some IV ondansetron. Hypokalemia standard and she's given intravenous potassium. She is noted to have persistent hypokalemia going back for the past 3 years and this is probably related to hydrochlorothiazide that she takes for hypertension.  CT is unremarkable. She feels much better after above noted treatment. She is discharged with prescriptions for K-Dur, loperamide, and ondansetron. She is to followup with her PCP.  Dione Boozeavid Izaya [place], MD 02/13/14 [phone]

## 2014-02-13 MED ORDER — ONDANSETRON HCL 4 MG PO TABS: 4.0000 mg | ORAL_TABLET | Freq: Four times a day (QID) | ORAL | Status: DC | PRN

## 2014-02-13 MED ORDER — IOHEXOL 300 MG/ML  SOLN
100.0000 mL | Freq: Once | INTRAMUSCULAR | Status: AC | PRN
  Administered 2014-02-13: 100 mL via INTRAVENOUS

## 2014-02-13 MED ORDER — LOPERAMIDE HCL 2 MG PO CAPS: 2.0000 mg | ORAL_CAPSULE | Freq: Four times a day (QID) | ORAL | Status: DC | PRN

## 2014-02-13 MED ORDER — POTASSIUM CHLORIDE CRYS ER 20 MEQ PO TBCR: 20.0000 meq | EXTENDED_RELEASE_TABLET | Freq: Every day | ORAL | Status: DC

## 2014-02-13 NOTE — Discharge Instructions (Signed)
Your potassium levels have been consistently low. This is because of one of your blood pressure medications. You have been given a prescription for potassium today. Please discuss with your PCP whether you should continue with potassium, or change the blood pressure medication.  Nausea and Vomiting Nausea is a sick feeling that often comes before throwing up (vomiting). Vomiting is a reflex where stomach contents come out of your mouth. Vomiting can cause severe loss of body fluids (dehydration). Children and elderly adults can become dehydrated quickly, especially if they also have diarrhea. Nausea and vomiting are symptoms of a condition or disease. It is important to find the cause of your symptoms. CAUSES   Direct irritation of the stomach lining. This irritation can result from increased acid production (gastroesophageal reflux disease), infection, food poisoning, taking certain medicines (such as nonsteroidal anti-inflammatory drugs), alcohol use, or tobacco use.  Signals from the brain.These signals could be caused by a headache, heat exposure, an inner ear disturbance, increased pressure in the brain from injury, infection, a tumor, or a concussion, pain, emotional stimulus, or metabolic problems.  An obstruction in the gastrointestinal tract (bowel obstruction).  Illnesses such as diabetes, hepatitis, gallbladder problems, appendicitis, kidney problems, cancer, sepsis, atypical symptoms of a heart attack, or eating disorders.  Medical treatments such as chemotherapy and radiation.  Receiving medicine that makes you sleep (general anesthetic) during surgery. DIAGNOSIS Your caregiver may ask for tests to be done if the problems do not improve after a few days. Tests may also be done if symptoms are severe or if the reason for the nausea and vomiting is not clear. Tests may include:  Urine tests.  Blood tests.  Stool tests.  Cultures (to look for evidence of infection).  X-rays or  other imaging studies. Test results can help your caregiver make decisions about treatment or the need for additional tests. TREATMENT You need to stay well hydrated. Drink frequently but in small amounts.You may wish to drink water, sports drinks, clear broth, or eat frozen ice pops or gelatin dessert to help stay hydrated.When you eat, eating slowly may help prevent nausea.There are also some antinausea medicines that may help prevent nausea. HOME CARE INSTRUCTIONS   Take all medicine as directed by your caregiver.  If you do not have an appetite, do not force yourself to eat. However, you must continue to drink fluids.  If you have an appetite, eat a normal diet unless your caregiver tells you differently.  Eat a variety of complex carbohydrates (rice, wheat, potatoes, bread), lean meats, yogurt, fruits, and vegetables.  Avoid high-fat foods because they are more difficult to digest.  Drink enough water and fluids to keep your urine clear or pale yellow.  If you are dehydrated, ask your caregiver for specific rehydration instructions. Signs of dehydration may include:  Severe thirst.  Dry lips and mouth.  Dizziness.  Dark urine.  Decreasing urine frequency and amount.  Confusion.  Rapid breathing or pulse. SEEK IMMEDIATE MEDICAL CARE IF:   You have blood or brown flecks (like coffee grounds) in your vomit.  You have black or bloody stools.  You have a severe headache or stiff neck.  You are confused.  You have severe abdominal pain.  You have chest pain or trouble breathing.  You do not urinate at least once every 8 hours.  You develop cold or clammy skin.  You continue to vomit for longer than 24 to 48 hours.  You have a fever. MAKE SURE YOU:  Understand these instructions.  Will watch your condition.  Will get help right away if you are not doing well or get worse. Document Released: 12/03/2005 Document Revised: 02/25/2012 Document Reviewed:  05/02/2011 [facility] Patient Information 2014 [place], [place].  Diarrhea Diarrhea is frequent loose and watery bowel movements. It can cause you to feel weak and dehydrated. Dehydration can cause you to become tired and thirsty, have a dry mouth, and have decreased urination that often is dark yellow. Diarrhea is a sign of another problem, most often an infection that will not last long. In most cases, diarrhea typically lasts 2 3 days. However, it can last longer if it is a sign of something more serious. It is important to treat your diarrhea as directed by your caregive to lessen or prevent future episodes of diarrhea. CAUSES  Some common causes include: Gastrointestinal infections caused by viruses, bacteria, or parasites. Food poisoning or food allergies. Certain medicines, such as antibiotics, chemotherapy, and laxatives. Artificial sweeteners and fructose. Digestive disorders. HOME CARE INSTRUCTIONS Ensure adequate fluid intake (hydration): have 1 cup (8 oz) of fluid for each diarrhea episode. Avoid fluids that contain simple sugars or sports drinks, fruit juices, whole milk products, and sodas. Your urine should be clear or pale yellow if you are drinking enough fluids. Hydrate with an oral rehydration solution that you can purchase at pharmacies, retail stores, and online. You can prepare an oral rehydration solution at home by mixing the following ingredients together:   tsp table salt.  tsp baking soda.  tsp salt substitute containing potassium chloride. 1  tablespoons sugar. 1 L (34 oz) of water. Certain foods and beverages may increase the speed at which food moves through the gastrointestinal (GI) tract. These foods and beverages should be avoided and include: Caffeinated and alcoholic beverages. High-fiber foods, such as raw fruits and vegetables, nuts, seeds, and whole grain breads and cereals. Foods and beverages sweetened with sugar alcohols, such as xylitol, sorbitol, and  mannitol. Some foods may be well tolerated and may help thicken stool including: Starchy foods, such as rice, toast, pasta, low-sugar cereal, oatmeal, grits, baked potatoes, crackers, and bagels. Bananas. Applesauce. Add probiotic-rich foods to help increase healthy bacteria in the GI tract, such as yogurt and fermented milk products. Wash your hands well after each diarrhea episode. Only take over-the-counter or prescription medicines as directed by your caregiver. Take a warm bath to relieve any burning or pain from frequent diarrhea episodes. SEEK IMMEDIATE MEDICAL CARE IF:  You are unable to keep fluids down. You have persistent vomiting. You have blood in your stool, or your stools are black and tarry. You do not urinate in 6 8 hours, or there is only a small amount of very dark urine. You have abdominal pain that increases or localizes. You have weakness, dizziness, confusion, or lightheadedness. You have a severe headache. Your diarrhea gets worse or does not get better. You have a fever or persistent symptoms for more than 2 3 days. You have a fever and your symptoms suddenly get worse. MAKE SURE YOU:  Understand these instructions. Will watch your condition. Will get help right away if you are not doing well or get worse. Document Released: 11/23/2002 Document Revised: 11/19/2012 Document Reviewed: 08/10/2012 [facility] Patient Information 2014 [place], [place].  Hypokalemia Hypokalemia means that the amount of potassium in the blood is lower than normal.Potassium is a chemical, called an electrolyte, that helps regulate the amount of fluid in the body. It also stimulates muscle contraction and helps  nerves function properly.Most of the body's potassium is inside of cells, and only a very small amount is in the blood. Because the amount in the blood is so small, minor changes can be life-threatening. CAUSES  Antibiotics.  Diarrhea or vomiting.  Using laxatives too much,  which can cause diarrhea.  Chronic kidney disease.  Water pills (diuretics).  Eating disorders (bulimia).  Low magnesium level.  Sweating a lot. SIGNS AND SYMPTOMS  Weakness.  Constipation.  Fatigue.  Muscle cramps.  Mental confusion.  Skipped heartbeats or irregular heartbeat (palpitations).  Tingling or numbness. DIAGNOSIS  Your health care provider can diagnose hypokalemia with blood tests. In addition to checking your potassium level, your health care provider may also check other lab tests. TREATMENT Hypokalemia can be treated with potassium supplements taken by mouth or adjustments in your current medicines. If your potassium level is very low, you may need to get potassium through a vein (IV) and be monitored in the hospital. A diet high in potassium is also helpful. Foods high in potassium are:  Nuts, such as peanuts and pistachios.  Seeds, such as sunflower seeds and pumpkin seeds.  Peas, lentils, and lima beans.  Whole grain and bran cereals and breads.  Fresh fruit and vegetables, such as apricots, avocado, bananas, cantaloupe, kiwi, oranges, tomatoes, asparagus, and potatoes.  Orange and tomato juices.  Red meats.  Fruit yogurt. HOME CARE INSTRUCTIONS  Take all medicines as prescribed by your health care provider.  Maintain a healthy diet by including nutritious food, such as fruits, vegetables, nuts, whole grains, and lean meats.  If you are taking a laxative, be sure to follow the directions on the label. SEEK MEDICAL CARE IF:  Your weakness gets worse.  You feel your heart pounding or racing.  You are vomiting or having diarrhea.  You are diabetic and having trouble keeping your blood glucose in the normal range. SEEK IMMEDIATE MEDICAL CARE IF:  You have chest pain, shortness of breath, or dizziness.  You are vomiting or having diarrhea for more than 2 days.  You faint. MAKE SURE YOU:   Understand these instructions.  Will watch  your condition.  Will get help right away if you are not doing well or get worse. Document Released: 12/03/2005 Document Revised: 09/23/2013 Document Reviewed: 06/05/2013 [facility] Patient Information 2014 [place].  Ondansetron tablets What is this medicine? ONDANSETRON (on DAN se tron) is used to treat nausea and vomiting caused by chemotherapy. It is also used to prevent or treat nausea and vomiting after surgery. This medicine may be used for other purposes; ask your health care provider or pharmacist if you have questions. COMMON BRAND NAME(S): Zofran What should I tell my health care provider before I take this medicine? They need to know if you have any of these conditions: -heart disease -history of irregular heartbeat -liver disease -low levels of magnesium or potassium in the blood -an unusual or allergic reaction to ondansetron, granisetron, other medicines, foods, dyes, or preservatives -pregnant or trying to get pregnant -breast-feeding How should I use this medicine? Take this medicine by mouth with a glass of water. Follow the directions on your prescription label. Take your doses at regular intervals. Do not take your medicine more often than directed. Talk to your pediatrician regarding the use of this medicine in children. Special care may be needed. Overdosage: If you think you have taken too much of this medicine contact a poison control center or emergency room at once. NOTE: This medicine  is only for you. Do not share this medicine with others. What if I miss a dose? If you miss a dose, take it as soon as you can. If it is almost time for your next dose, take only that dose. Do not take double or extra doses. What may interact with this medicine? Do not take this medicine with any of the following medications: -apomorphine -certain medicines for fungal infections like fluconazole, itraconazole, ketoconazole, posaconazole,  voriconazole -cisapride -dofetilide -dronedarone -pimozide -thioridazine -ziprasidone  This medicine may also interact with the following medications: -carbamazepine -certain medicines for depression, anxiety, or psychotic disturbances -fentanyl -linezolid -MAOIs like Carbex, Eldepryl, Marplan, Nardil, and Parnate -methylene blue (injected into a vein) -other medicines that prolong the QT interval (cause an abnormal heart rhythm) -phenytoin -rifampicin -tramadol This list may not describe all possible interactions. Give your health care provider a list of all the medicines, herbs, non-prescription drugs, or dietary supplements you use. Also tell them if you smoke, drink alcohol, or use illegal drugs. Some items may interact with your medicine. What should I watch for while using this medicine? Check with your doctor or health care professional right away if you have any sign of an allergic reaction. What side effects may I notice from receiving this medicine? Side effects that you should report to your doctor or health care professional as soon as possible: -allergic reactions like skin rash, itching or hives, swelling of the face, lips or tongue -breathing problems -confusion -dizziness -fast or irregular heartbeat -feeling faint or lightheaded, falls -fever and chills -loss of balance or coordination -seizures -sweating -swelling of the hands or feet -tightness in the chest -tremors -unusually weak or tired Side effects that usually do not require medical attention (report to your doctor or health care professional if they continue or are bothersome): -constipation or diarrhea -headache This list may not describe all possible side effects. Call your doctor for medical advice about side effects. You may report side effects to FDA at 1-800-FDA-1088. Where should I keep my medicine? Keep out of the reach of children. Store between 2 and 30 degrees C (36 and 86 degrees F).  Throw away any unused medicine after the expiration date. NOTE: This sheet is a summary. It may not cover all possible information. If you have questions about this medicine, talk to your doctor, pharmacist, or health care provider.  2014, [employer]. (2013-09-09 16:27:45)  Loperamide tablets or capsules What is this medicine? LOPERAMIDE (loe PER a mide) is used to treat diarrhea. This medicine may be used for other purposes; ask your health care provider or pharmacist if you have questions. COMMON BRAND NAME(S): Anti-Diarrheal, Imodium A-D, K-Pek II What should I tell my health care provider before I take this medicine? They need to know if you have any of these conditions: -a black or bloody stool -bacterial food poisoning -colitis or mucus in your stool -currently taking an antibiotic medication for an infection -fever -liver disease -severe abdominal pain, swelling or bulging -an unusual or allergic reaction to loperamide, other medicines, foods, dyes, or preservatives -pregnant or trying to get pregnant -breast-feeding How should I use this medicine? Take this medicine by mouth with a glass of water. Follow the directions on the prescription label. Take your doses at regular intervals. Do not take your medicine more often than directed. Talk to your pediatrician regarding the use of this medicine in children. Special care may be needed. Overdosage: If you think you have taken too much of this  medicine contact a poison control center or emergency room at once. NOTE: This medicine is only for you. Do not share this medicine with others. What if I miss a dose? This does not apply. This medicine is not for regular use. Only take this medicine while you continue to have loose bowel movements. Do not take more medicine than recommended by the packaging label or by your healthcare professional. What may interact with this medicine? Do not take this medicine with any of the  following medications: -alosetron This medicine may also interact with the following medications: -quinidine -ritonavir -saquinavir This list may not describe all possible interactions. Give your health care provider a list of all the medicines, herbs, non-prescription drugs, or dietary supplements you use. Also tell them if you smoke, drink alcohol, or use illegal drugs. Some items may interact with your medicine. What should I watch for while using this medicine? Do not take this medicine for more than 1 week without asking your doctor or health care professional. If your symptoms do not start to get better after two days, you may have a problem that needs further evaluation. Check with your doctor or health care professional right away if you develop a fever, severe abdominal pain, swelling or bulging, or if you have have bloody/black diarrhea or stools. You may get drowsy or dizzy. Do not drive, use machinery, or do anything that needs mental alertness until you know how this medicine affects you. Do not stand or sit up quickly, especially if you are an older patient. This reduces the risk of dizzy or fainting spells. Alcohol can increase possible drowsiness and dizziness. Avoid alcoholic drinks. Your mouth may get dry. Chewing sugarless gum or sucking hard candy, and drinking plenty of water may help. Contact your doctor if the problem does not go away or is severe. Drinking plenty of water can also help prevent dehydration that can occur with diarrhea. Elderly patients may have a more variable response to the effects of this medicine, and are more susceptible to the effects of dehydration. What side effects may I notice from receiving this medicine? Side effects that you should report to your doctor or health care professional as soon as possible: -allergic reactions like skin rash, itching or hives, swelling of the face, lips, or tongue -bloated, swollen feeling in your abdomen -blurred  vision -loss of appetite -stomach pain Side effects that usually do not require medical attention (report to your doctor or health care professional if they continue or are bothersome): -constipation -drowsiness or dizziness -dry mouth -nausea, vomiting This list may not describe all possible side effects. Call your doctor for medical advice about side effects. You may report side effects to FDA at 1-800-FDA-1088. Where should I keep my medicine? Keep out of the reach of children. Store at room temperature between 15 and 25 degrees C (59 and 77 degrees F). Keep container tightly closed. Throw away any unused medicine after the expiration date. NOTE: This sheet is a summary. It may not cover all possible information. If you have questions about this medicine, talk to your doctor, pharmacist, or health care provider.  2014, [employer] Standard. (2008-06-08 16:02:13)  Potassium Salts tablets, extended-release tablets or capsules What is this medicine? POTASSIUM (poe TASS i um) is a natural salt that is important for the heart, muscles, and nerves. It is found in many foods and is normally supplied by a well balanced diet. This medicine is used to treat low potassium. This medicine may be  used for other purposes; ask your health care provider or pharmacist if you have questions. COMMON BRAND NAME(S): ED-K+10, Glu-K, K-10 , K-8, K-Dur, K-Tab, Kaon-CL, Klor-Con M10, Klor-Con M15, Klor-Con M20, Klor-Con, Klotrix, Micro-K Extencaps, Micro-K, Slow-K What should I tell my health care provider before I take this medicine? They need to know if you have any of these conditions: -dehydration -diabetes -irregular heartbeat -kidney disease -stomach ulcers or other stomach problems -an unusual or allergic reaction to potassium salts, other medicines, foods, dyes, or preservatives -pregnant or trying to get pregnant -breast-feeding How should I use this medicine? Take this medicine by mouth with a  full glass of water. Follow the directions on the prescription label. Take with food. Do not suck on, crush, or chew this medicine. If you have difficulty swallowing, ask the pharmacist how to take. Take your medicine at regular intervals. Do not take it more often than directed. Do not stop taking except on your doctor's advice. Talk to your pediatrician regarding the use of this medicine in children. Special care may be needed. Overdosage: If you think you have taken too much of this medicine contact a poison control center or emergency room at once. NOTE: This medicine is only for you. Do not share this medicine with others. What if I miss a dose? If you miss a dose, take it as soon as you can. If it is almost time for your next dose, take only that dose. Do not take double or extra doses. What may interact with this medicine? Do not take this medicine with any of the following medications: -eplerenone -sodium polystyrene sulfonate This medicine may also interact with the following medications: -medicines for blood pressure or heart disease like lisinopril, losartan, quinapril, valsartan -medicines for cold or allergies -medicines for inflammation like ibuprofen, indomethacin -medicines for Parkinson's disease -medicines for the stomach like metoclopramide, dicyclomine, glycopyrrolate -some diuretics This list may not describe all possible interactions. Give your health care provider a list of all the medicines, herbs, non-prescription drugs, or dietary supplements you use. Also tell them if you smoke, drink alcohol, or use illegal drugs. Some items may interact with your medicine. What should I watch for while using this medicine? Visit your doctor or health care professional for regular check ups. You will need lab work done regularly. You may need to be on a special diet while taking this medicine. Ask your doctor. What side effects may I notice from receiving this medicine? Side effects  that you should report to your doctor or health care professional as soon as possible: -allergic reactions like skin rash, itching or hives, swelling of the face, lips, or tongue -black, tarry stools -heartburn -irregular heartbeat -numbness or tingling in hands or feet -pain when swallowing -unusually weak or tired Side effects that usually do not require medical attention (report to your doctor or health care professional if they continue or are bothersome): -diarrhea -nausea -stomach gas -vomiting This list may not describe all possible side effects. Call your doctor for medical advice about side effects. You may report side effects to FDA at 1-800-FDA-1088. Where should I keep my medicine? Keep out of the reach of children. Store at room temperature between 15 and 30 degrees C (59 and 86 degrees F ). Keep bottle closed tightly to protect this medicine from light and moisture. Throw away any unused medicine after the expiration date. NOTE: This sheet is a summary. It may not cover all possible information. If you have questions about this  medicine, talk to your doctor, pharmacist, or health care provider.  2014, [employer] Standard. (2008-02-18 11:17:31)

## 2014-03-26 IMAGING — CT CT ABD-PELV W/ CM
1 of 3 series · 14 of 32 positions shown, 19 images · IV contrast (OMNIPAQUE 300)
Comparison: CT of the abdomen and pelvis performed 06/29/2013

CLINICAL DATA: Mid abdominal pain, nausea, vomiting and diarrhea.

EXAM:
CT ABDOMEN AND PELVIS WITH CONTRAST
TECHNIQUE: Multidetector CT imaging of the abdomen and pelvis was performed
using the standard protocol following bolus administration of
intravenous contrast.
CONTRAST:  50mL OMNIPAQUE IOHEXOL 300 MG/ML SOLN, 100mL OMNIPAQUE
IOHEXOL 300 MG/ML SOLN

[Series 2: abd/pel with · axial · 0.97mm/px · z∈[-487,-67]mm · 14 of 96 slices shown, 19 images]
[im 6/96  soft-tissue]
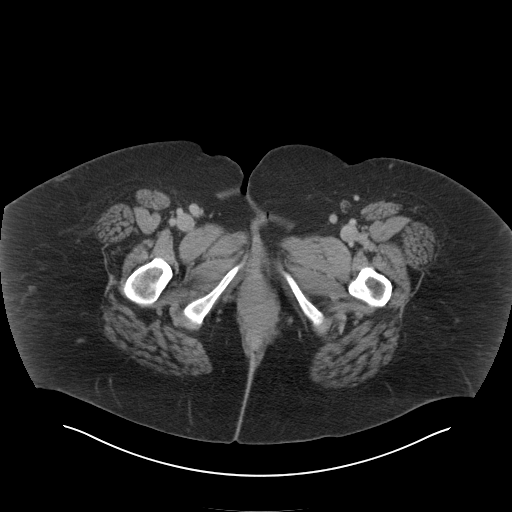
[im 6/96  bone]
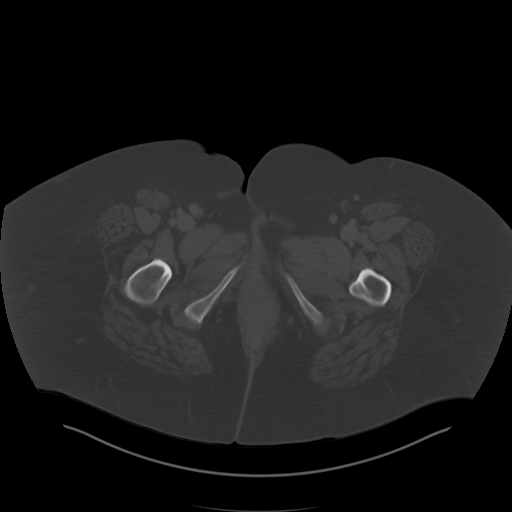
[im 11/96  soft-tissue]
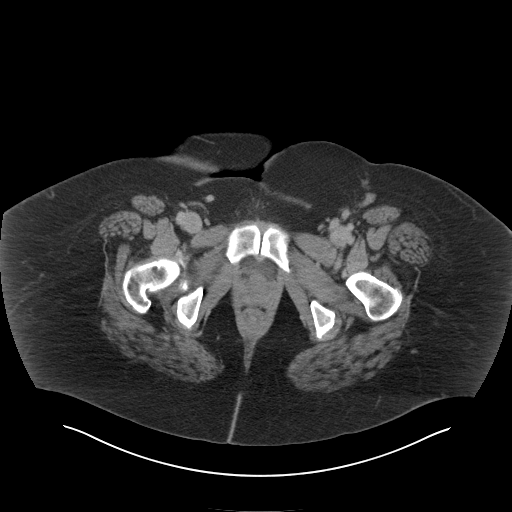
[im 22/96  soft-tissue]
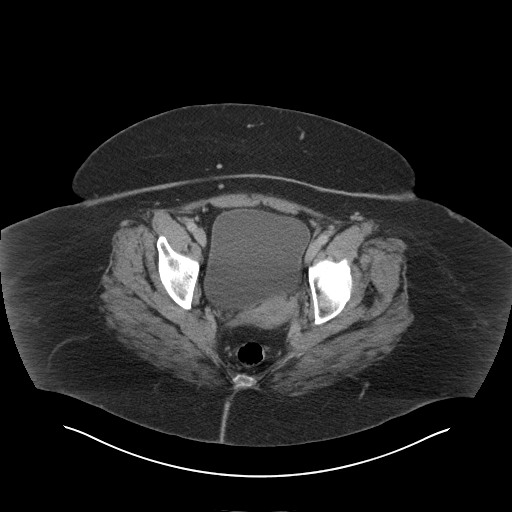
[im 27/96  soft-tissue]
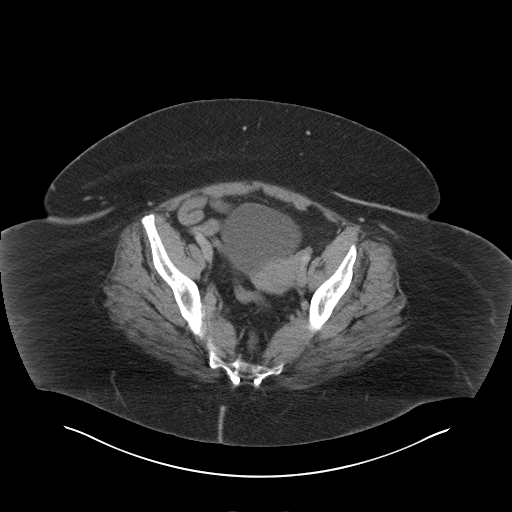
[im 32/96  soft-tissue]
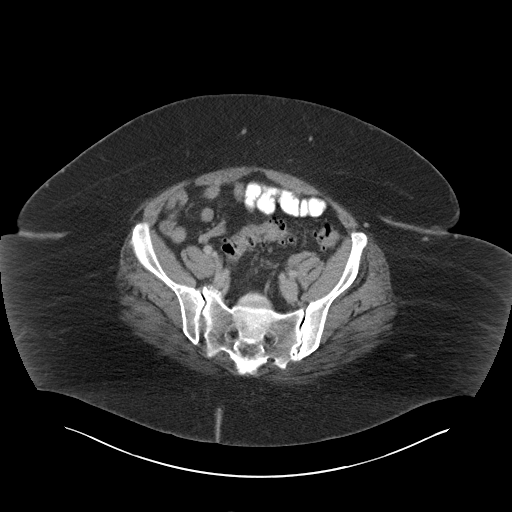
[im 43/96  soft-tissue]
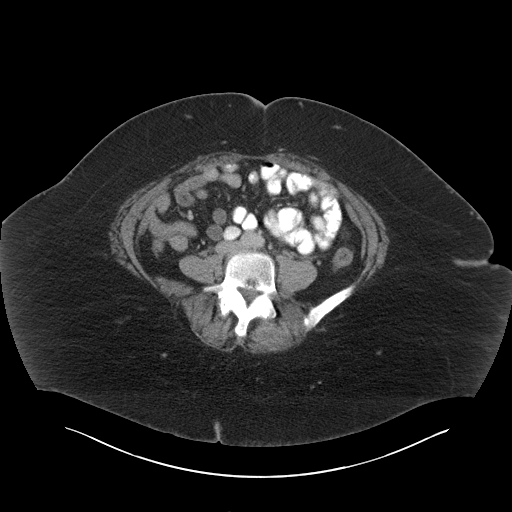
[im 48/96  soft-tissue]
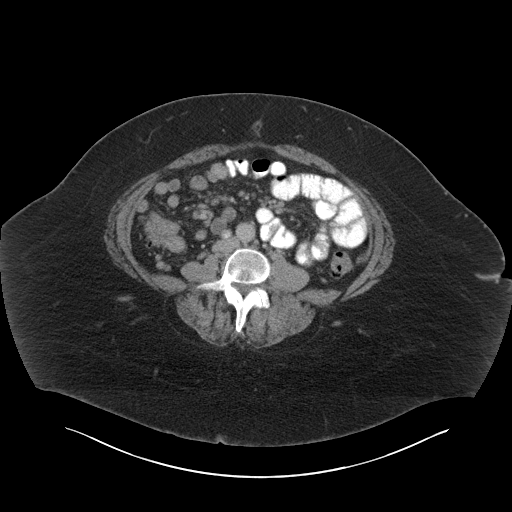
[im 53/96  soft-tissue]
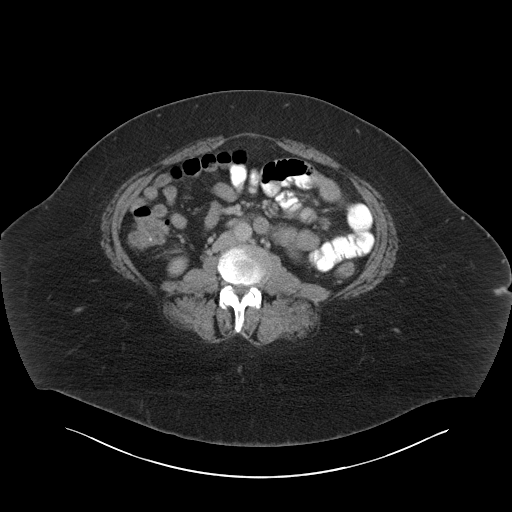
[im 64/96  soft-tissue]
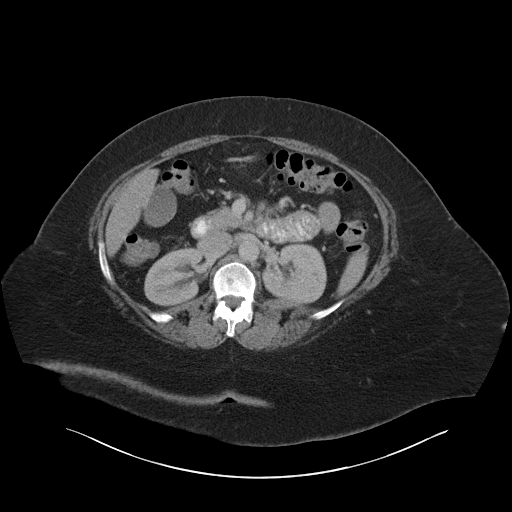
[im 64/96  bone]
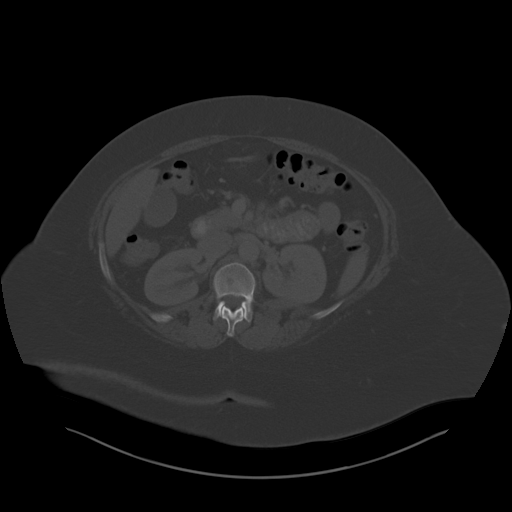
[im 69/96  soft-tissue]
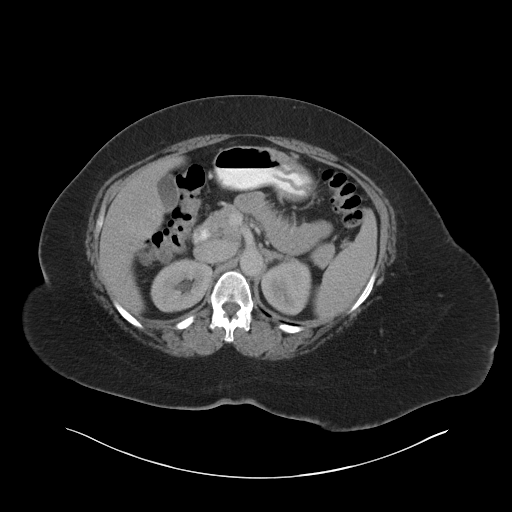
[im 74/96  soft-tissue]
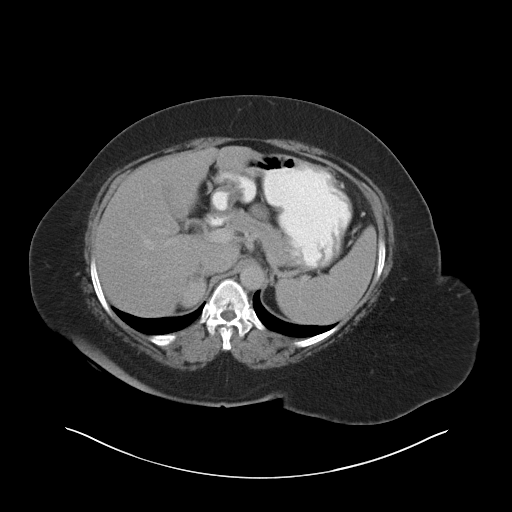
[im 74/96  lung]
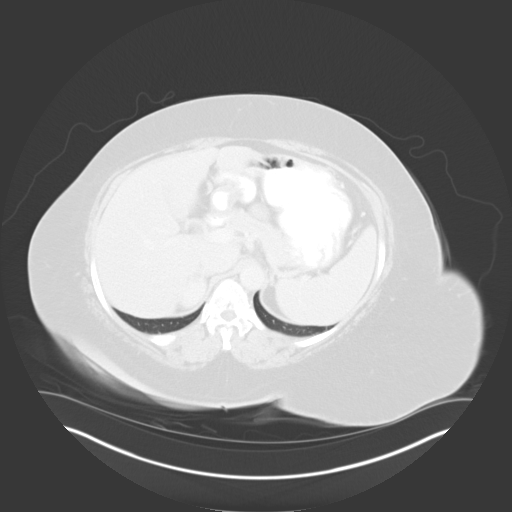
[im 80/96  lung]
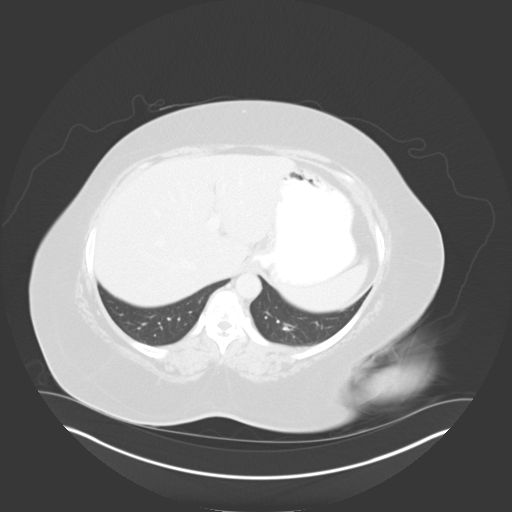
[im 85/96  soft-tissue]
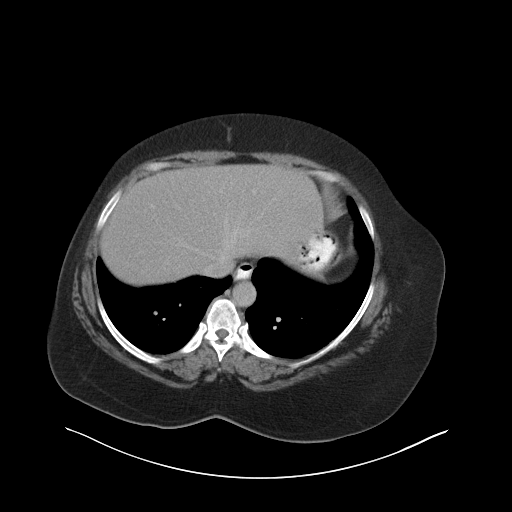
[im 85/96  lung]
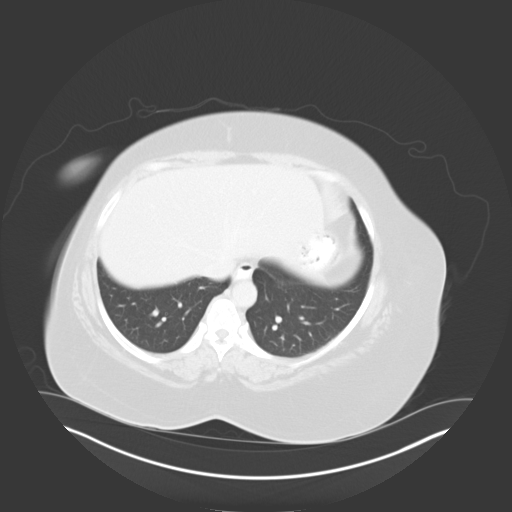
[im 90/96  soft-tissue]
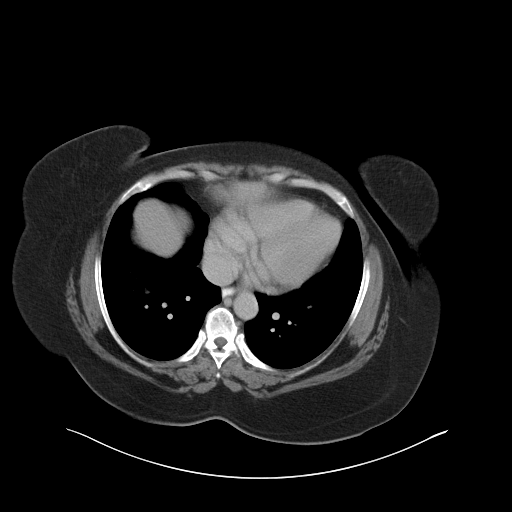
[im 90/96  lung]
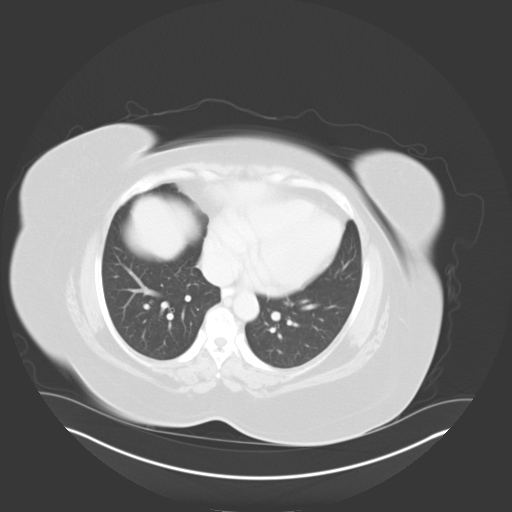

[14 of 32 positions shown; findings below may reference images not displayed]

FINDINGS: The visualized lung bases are clear.

The liver and spleen are unremarkable in appearance. The gallbladder
is within normal limits. The pancreas and adrenal glands are
unremarkable.

The kidneys are unremarkable in appearance. There is no evidence of
hydronephrosis. No renal or ureteral stones are seen. No perinephric
stranding is appreciated.

No free fluid is identified. The small bowel is unremarkable in
appearance. The stomach is within normal limits. No acute vascular
abnormalities are seen.

The appendix is grossly unremarkable in appearance, though difficult
to fully assess. There is no definite evidence of appendicitis. Mild
diverticulosis is noted about the cecum, and additional mild
diverticulosis is seen along the distal descending and sigmoid
colon. There is no definite evidence of diverticulitis.

The bladder is mildly distended and grossly unremarkable in
appearance. The uterus is within normal limits. The ovaries are
relatively symmetric; no suspicious adnexal masses are seen. No
inguinal lymphadenopathy is seen.

No acute osseous abnormalities are identified.
IMPRESSION: 1. No acute abnormality seen within the abdomen or pelvis.
2. Mild diverticulosis about the cecum and along the distal
descending and sigmoid colon, without evidence of diverticulitis.

## 2015-08-27 ENCOUNTER — Encounter (HOSPITAL_COMMUNITY): Payer: Self-pay | Admitting: Emergency Medicine

## 2015-08-27 ENCOUNTER — Emergency Department (INDEPENDENT_AMBULATORY_CARE_PROVIDER_SITE_OTHER)
Admission: EM | Admit: 2015-08-27 | Discharge: 2015-08-27 | Disposition: A | Discharge status: Home / Self Care | Payer: Self-pay | Source: Home / Self Care | Attending: Family Medicine | Admitting: Family Medicine

## 2015-08-27 DIAGNOSIS — S161XXA Strain of muscle, fascia and tendon at neck level, initial encounter: Secondary | ICD-10-CM

## 2015-08-27 DIAGNOSIS — S39012A Strain of muscle, fascia and tendon of lower back, initial encounter: Secondary | ICD-10-CM

## 2015-08-27 DIAGNOSIS — T148 Other injury of unspecified body region: Secondary | ICD-10-CM

## 2015-08-27 DIAGNOSIS — T148XXA Other injury of unspecified body region, initial encounter: Secondary | ICD-10-CM

## 2015-08-27 MED ORDER — DICLOFENAC POTASSIUM 50 MG PO TABS: 50.0000 mg | ORAL_TABLET | Freq: Three times a day (TID) | ORAL | Status: DC
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
## 2016-04-26 ENCOUNTER — Other Ambulatory Visit: Payer: Self-pay

## 2016-04-26 DIAGNOSIS — Z1231 Encounter for screening mammogram for malignant neoplasm of breast: Secondary | ICD-10-CM

## 2016-05-09 ENCOUNTER — Ambulatory Visit: Payer: Medicaid Other

## 2016-05-17 ENCOUNTER — Ambulatory Visit
Admission: RE | Admit: 2016-05-17 | Discharge: 2016-05-17 | Disposition: A | Discharge status: Home / Self Care | Payer: Medicaid Other | Source: Ambulatory Visit

## 2016-05-17 DIAGNOSIS — Z1231 Encounter for screening mammogram for malignant neoplasm of breast: Secondary | ICD-10-CM

## 2016-08-08 ENCOUNTER — Encounter (HOSPITAL_COMMUNITY): Payer: Self-pay | Admitting: Emergency Medicine

## 2016-08-08 ENCOUNTER — Ambulatory Visit (HOSPITAL_COMMUNITY)
Admission: EM | Admit: 2016-08-08 | Discharge: 2016-08-08 | Disposition: A | Discharge status: Home / Self Care | Payer: Medicaid Other | Attending: Family Medicine | Admitting: Family Medicine

## 2016-08-08 DIAGNOSIS — M5431 Sciatica, right side: Secondary | ICD-10-CM | POA: Diagnosis not present

## 2016-08-08 LAB — POCT URINALYSIS DIP (DEVICE)
BILIRUBIN URINE: NEGATIVE
Glucose, UA: NEGATIVE mg/dL
HGB URINE DIPSTICK: NEGATIVE
KETONES UR: NEGATIVE mg/dL
LEUKOCYTES UA: NEGATIVE
NITRITE: NEGATIVE
PH: 7 (ref 5.0–8.0)
Protein, ur: NEGATIVE mg/dL
Specific Gravity, Urine: 1.01 (ref 1.005–1.030)
Urobilinogen, UA: 0.2 mg/dL (ref 0.0–1.0)

## 2016-08-08 MED ORDER — PREDNISONE 20 MG PO TABS: 20.0000 mg | ORAL_TABLET | Freq: Every day | ORAL | 0 refills | Status: AC

## 2016-08-08 NOTE — ED Provider Notes (Signed)
CSN: [id number]     Arrival date & time 08/08/16  1650 History   First MD Initiated Contact with Patient 08/08/16 1816     Chief Complaint  Patient presents with  . Back Pain   (Consider location/radiation/quality/duration/timing/severity/associated sxs/prior Treatment) Patient is not a good historian. She reports low back pain x 1 year. She also reports urinary frequency and "almond-smelling" urine for 3 months. She never had back xray; unsure if she has degenerative changes in her back.     The history is provided by the patient.  Back Pain  Pain location: right lower back and right hip. Quality:  Shooting Radiates to:  Does not radiate Pain severity:  Moderate Pain is:  Worse during the day Onset quality:  Gradual Duration:  12 months Timing:  Constant Progression:  Worsening Context: not falling, not jumping from heights, not lifting heavy objects, not MVA, not occupational injury and not recent injury   Relieved by: sitting still  Worsened by:  Bending (lifting) Ineffective treatments:  OTC medications Associated symptoms: abdominal pain   Associated symptoms: no chest pain, no dysuria, no fever, no leg pain, no numbness, no tingling, no weakness and no weight loss   Associated symptoms comment:  Sometimes have abdominal pain    Past Medical History:  Diagnosis Date  . Arthritis   . Hypertension    History reviewed. No pertinent surgical history. History reviewed. No pertinent family history. Social History  Substance Use Topics  . Smoking status: Never Smoker  . Smokeless tobacco: Never Used  . Alcohol use No   OB History    No data available     Review of Systems  Constitutional: Negative for chills, fatigue, fever and weight loss.  HENT: Negative.   Eyes: Negative.   Respiratory: Negative for shortness of breath.   Cardiovascular: Negative for chest pain, palpitations and leg swelling.  Gastrointestinal: Positive for abdominal pain. Negative for  diarrhea, nausea and vomiting.       Abdominal pain is intermittent, no pain currently   Genitourinary: Positive for frequency. Negative for dysuria and urgency.       Positive for "almond-smelling" urine  Musculoskeletal: Positive for back pain.  Neurological: Negative for dizziness, tingling, weakness and numbness.    Allergies  Review of patient's allergies indicates no known allergies.  Home Medications   Prior to Admission medications   Medication Sig Start Date End Date Taking? Authorizing Provider  hydrochlorothiazide (HYDRODIURIL) 25 MG tablet Take 25 mg by mouth every evening.    Yes Historical Provider, MD  omeprazole (PRILOSEC) 20 MG capsule Take 20 mg by mouth 2 (two) times daily.    Yes Historical Provider, MD  potassium chloride SA (K-DUR,KLOR-CON) 20 MEQ tablet Take 1 tablet (20 mEq total) by mouth daily. 02/13/14  Yes Dione Boozeavid Glick, MD  celecoxib (CELEBREX) 200 MG capsule Take 200 mg by mouth daily as needed for mild pain.     Historical Provider, MD  diclofenac (CATAFLAM) 50 MG tablet Take 1 tablet (50 mg total) by mouth 3 (three) times daily. One tablet TID with food prn pain. 08/27/15   Hayden Rasmussenavid Mabe, NP  HYDROcodone-acetaminophen (NORCO/VICODIN) 5-325 MG per tablet Take 1 tablet by mouth every 6 (six) hours as needed for moderate pain.    Historical Provider, MD  loperamide (IMODIUM) 2 MG capsule Take 1 capsule (2 mg total) by mouth 4 (four) times daily as needed for diarrhea or loose stools. 02/13/14   Dione Boozeavid Glick, MD  metoprolol (TOPROL-XL) 50 MG  24 hr tablet Take 50 mg by mouth every evening.     Historical Provider, MD  ondansetron (ZOFRAN) 4 MG tablet Take 1 tablet (4 mg total) by mouth every 6 (six) hours as needed for nausea or vomiting. 02/13/14   Dione Boozeavid Glick, MD  predniSONE (DELTASONE) 20 MG tablet Take 1 tablet (20 mg total) by mouth daily with breakfast. Take 3 tablets for day 1-3, take 2 tablets from day 4-6, take 1 tablet from day 7-9 08/08/16 08/17/16  Lucia EstelleFeng Eyvonne Burchfield, NP   sertraline (ZOLOFT) 100 MG tablet Take 100 mg by mouth every evening.     Historical Provider, MD   Meds Ordered and Administered this Visit  Medications - No data to display  BP 156/84 (BP Location: Left Arm)   Pulse 73   Temp 97.5 F (36.4 C) (Oral)   Resp 18   Ht 5' 5.5" (1.664 m)   Wt 220 lb (99.8 kg)   SpO2 100%   BMI 36.05 kg/m  No data found.   Physical Exam  Constitutional: She is oriented to person, place, and time. She appears well-developed and well-nourished. No distress.  Neck: Normal range of motion. Neck supple.  Cardiovascular: Normal rate, regular rhythm and normal heart sounds.   No murmur heard. Pulmonary/Chest: Breath sounds normal. No respiratory distress.  Abdominal: Soft. Bowel sounds are normal. There is no tenderness.  Genitourinary:  Genitourinary Comments: Positive right cva tenderness   Musculoskeletal: Normal range of motion. She exhibits no edema, tenderness or deformity.  Positive straight leg raise on right side. Has no tenderness on palpation over her lumbar region. No swelling or deformity noted   Neurological: She is alert and oriented to person, place, and time.  Skin: Skin is warm and dry. She is not diaphoretic.  Psychiatric: She has a normal mood and affect.  Nursing note and vitals reviewed.   Urgent Care Course   Clinical Course    Procedures (including critical care time)  Labs Review Labs Reviewed  POCT URINALYSIS DIP (DEVICE)    Imaging Review No results found.   MDM   1. Sciatica of right side    UA was negative. Sciatica neuralgia is suspected. Instructed to take ibuprofen up to 800 mg TID for pain, and take prednisone as prescribed. Reviewed directions for usage and side effects. Patient states understanding and will call with questions or problems. Patient instructed to call or follow up with his/her primary care doctor if failure to improve or change in symptoms. Discharge instruction given. All questions were  answered.    Lucia EstelleFeng Malyiah Fellows, NP 08/08/16 1858

## 2016-08-08 NOTE — Discharge Instructions (Signed)
Take prednisone as prescribed. Take Ibuprofen at home up to 800mg  TID for pain. Follow up with your primary care doctor in 1-2 weeks for recheck.

## 2016-08-08 NOTE — ED Triage Notes (Signed)
[facility] a [facility] of right side lower back pain that radiates into her right hip. The patient denied any known injuries. The patient stated that she has been having the pain for over 2 months.

## 2016-09-14 LAB — PROCEDURE REPORT - SCANNED: PAP SMEAR: NEGATIVE

## 2017-09-02 ENCOUNTER — Ambulatory Visit (INDEPENDENT_AMBULATORY_CARE_PROVIDER_SITE_OTHER): Payer: Self-pay | Admitting: Orthopaedic Surgery

## 2017-09-11 ENCOUNTER — Ambulatory Visit (INDEPENDENT_AMBULATORY_CARE_PROVIDER_SITE_OTHER): Payer: Medicaid Other

## 2017-09-11 ENCOUNTER — Ambulatory Visit (INDEPENDENT_AMBULATORY_CARE_PROVIDER_SITE_OTHER): Payer: Medicaid Other | Admitting: Orthopaedic Surgery

## 2017-09-11 ENCOUNTER — Encounter (INDEPENDENT_AMBULATORY_CARE_PROVIDER_SITE_OTHER): Payer: Self-pay | Admitting: Orthopaedic Surgery

## 2017-09-11 DIAGNOSIS — M1611 Unilateral primary osteoarthritis, right hip: Secondary | ICD-10-CM | POA: Diagnosis not present

## 2017-09-11 DIAGNOSIS — M25551 Pain in right hip: Secondary | ICD-10-CM | POA: Diagnosis not present

## 2017-09-11 NOTE — Progress Notes (Signed)
Office Visit Note   Patient: Lisa Macdonald           Date of Birth: 1961-06-28           MRN: [id number] Visit Date: 09/11/2017              Requested by: Fleet Contras, MD [address] [place], [place] [address] PCP: Fleet Contras, MD   Assessment & Plan: Visit Diagnoses:  1. Pain in right hip   2. Unilateral primary osteoarthritis, right hip     Plan: I do feel that she would benefit from a one-time intra-articular steroid injection in her right hip. I gave her handout on hip replacement surgery and I went over x-rays with her and talked about what hip replacement involves. My biggest concern would be her weight and where the distribution of her adipose tissue is. My other concern is her health in general in terms of whether or not she is a diabetic and the recent lightheadedness that she's had. Also she reports that recently she has had some teeth falling out and I be concerned about dental hygiene and how poor dentation can detrimentally effect hip replacement surgery. We will release set her up for an intra-articular steroid injection and see what that does for her and I would like to see her back in follow-up after that injection.  Follow-Up Instructions: Return in about 4 weeks (around 10/09/2017).   Orders:  Orders Placed This Encounter  Procedures  . XR HIP UNILAT W OR W/O PELVIS 2-3 VIEWS RIGHT  . Ambulatory referral to Physical Medicine Rehab   No orders of the defined types were placed in this encounter.     Procedures: No procedures performed   Clinical Data: No additional findings.   Subjective: Chief Complaint  Patient presents with  . Right Hip - Pain  The patient is a very pleasant morbidly obese 56 year old female who comes for evaluation of right hip pain. Her pain is mainly in the groin and with ambulating. It does wake her up at night and pivoting activities cause her quite a bit of pain as well. She is someone who does have a history of high  blood pressure. She is unsure whether or not she is been diagnosed with diabetes in the past but has never been told she is a diabetic are not. She does report 2 days ago being lightheaded and I recommended she see her primary care physician at this continues. She did show me that she is on blood pressure medications. Her pain can be 10 out of 10 at times.  HPI  Review of Systems She denies any current chest pain or shortness of breath. She denies any fever, chills, nausea, vomiting.  Objective: Vital Signs: There were no vitals taken for this visit.  Physical Exam She is alert and oriented 3 and in no acute distress Ortho Exam On examination she does have a difficult time getting up on the exam table itself. Her left hip exam is normal. The right hip show severe pain with attempts of internal/external rotation. When I have her lay supine she does have a significant amount of adipose tissue more posterior than anterior around the thigh and she does have a significant pannus that I have to mobilize to see the soft tissue around her hip itself. Specialty Comments:  No specialty comments available.  Imaging: Xr Hip Unilat W Or W/o Pelvis 2-3 Views Right  Result Date: 09/11/2017 An AP pelvis and a lateral of her  right hip shows significant arthritis of the right hip with joint space narrowing, periarticular osteophytes, cystic changes and sclerotic changes consistent with osteoarthritis.    PMFS History: Patient Active Problem List   Diagnosis Date Noted  . Unilateral primary osteoarthritis, right hip 09/11/2017  . Pain in right hip 09/11/2017   Past Medical History:  Diagnosis Date  . Arthritis   . Hypertension     No family history on file.  No past surgical history on file. Social History   Occupational History  . Not on file.   Social History Main Topics  . Smoking status: Never Smoker  . Smokeless tobacco: Never Used  . Alcohol use No  . Drug use: No  . Sexual  activity: Not on file

## 2017-09-26 ENCOUNTER — Ambulatory Visit (INDEPENDENT_AMBULATORY_CARE_PROVIDER_SITE_OTHER): Payer: Medicaid Other | Admitting: Physical Medicine and Rehabilitation

## 2017-10-08 ENCOUNTER — Telehealth (INDEPENDENT_AMBULATORY_CARE_PROVIDER_SITE_OTHER): Payer: Self-pay | Admitting: Orthopaedic Surgery

## 2017-10-08 NOTE — Telephone Encounter (Signed)
Called patient left message on voicemail to return call. Note: Patient does not need to keep appointment with Dr Magnus IvanBlackman tomorrow. Patient need to schedule 2 week appointment after seeing Dr Alvester MorinNewton 10/10/17.  [phone]

## 2017-10-08 NOTE — Telephone Encounter (Signed)
Patient called asked if she need to keep her appointment with Dr. Magnus IvanBlackman tomorrow. Patient said she also have an appointment with Dr Alvester MorinNewton on 10/10/17. The number to contact patient is [phone]

## 2017-10-08 NOTE — Telephone Encounter (Signed)
Patient scheduled 10/24/17 at 2:30pm with Dr Magnus IvanBlackman.

## 2017-10-08 NOTE — Telephone Encounter (Signed)
She needs to schedule her appt with Magnus IvanBlackman a couple weeks after she has her injection with Alvester MorinNewton

## 2017-10-08 NOTE — Telephone Encounter (Signed)
She does not need to see me tomorrow

## 2017-10-08 NOTE — Telephone Encounter (Signed)
See below, is it neccessary to keep her appt if [facility]'t [facility]?

## 2017-10-09 ENCOUNTER — Ambulatory Visit (INDEPENDENT_AMBULATORY_CARE_PROVIDER_SITE_OTHER): Payer: Medicaid Other | Admitting: Orthopaedic Surgery

## 2017-10-10 ENCOUNTER — Ambulatory Visit (INDEPENDENT_AMBULATORY_CARE_PROVIDER_SITE_OTHER): Payer: Medicaid Other | Admitting: Physical Medicine and Rehabilitation

## 2017-10-10 ENCOUNTER — Encounter (INDEPENDENT_AMBULATORY_CARE_PROVIDER_SITE_OTHER): Payer: Self-pay | Admitting: Physical Medicine and Rehabilitation

## 2017-10-10 ENCOUNTER — Ambulatory Visit (INDEPENDENT_AMBULATORY_CARE_PROVIDER_SITE_OTHER): Payer: Medicaid Other

## 2017-10-10 VITALS — BP 127/69 | HR 61

## 2017-10-10 DIAGNOSIS — G8929 Other chronic pain: Secondary | ICD-10-CM

## 2017-10-10 DIAGNOSIS — M25551 Pain in right hip: Secondary | ICD-10-CM | POA: Diagnosis not present

## 2017-10-10 DIAGNOSIS — M25511 Pain in right shoulder: Secondary | ICD-10-CM | POA: Diagnosis not present

## 2017-10-10 NOTE — Patient Instructions (Signed)

## 2017-10-10 NOTE — Progress Notes (Addendum)
   Lisa Macdonald - 56 y.o. female MRN [id number]  Date of birth: 02/26/1961  Office Visit Note: Visit Date: 10/10/2017 PCP: Fleet ContrasAvbuere, Edwin, MD Referred by: Fleet ContrasAvbuere, Edwin, MD  Subjective: Chief Complaint  Patient presents with  . Right Hip - Pain   HPI: [name] is a 56 year old female with complaints of chronic worsening right hip and groin pain.  She has been seen and evaluated by Dr. Magnus IvanBlackman and he requests a diagnostic and hopefully therapeutic right hip intra-articular anesthetic arthrogram.    ROS Otherwise per HPI.  Assessment & Plan: Visit Diagnoses:  1. Pain in right hip   2. Chronic right hip pain     Plan: Findings:  Right hip anesthetic arthrogram.  Patient did get some relief during the anesthetic phase.    Meds & Orders:  Meds ordered this encounter  Medications  . bupivacaine (MARCAINE) 0.5 % (with pres) injection 3 mL  . triamcinolone acetonide (KENALOG-40) injection 80 mg    Orders Placed This Encounter  Procedures  . Large Joint Injection/Arthrocentesis  . XR C-ARM NO REPORT    Follow-up: Return if symptoms worsen or fail to improve.   Procedures: Large Joint Inj Date/Time: 10/10/2017 2:07 PM Performed by: Tyrell AntonioNEWTON, Doaa Kendzierski Authorized by: Tyrell AntonioNEWTON, Ivanell Deshotel   Consent Given by:  Patient Site marked: the procedure site was marked   Timeout: prior to procedure the correct patient, procedure, and site was verified   Indications:  Pain and diagnostic evaluation Location:  Hip Site:  R hip joint Prep: patient was prepped and draped in usual sterile fashion   Needle Size:  22 G Approach:  Anterior Ultrasound Guidance: No   Fluoroscopic Guidance: No   Arthrogram: Yes   Medications:  3 mL bupivacaine 0.5 %; 80 mg triamcinolone acetonide 40 MG/ML Aspiration Attempted: Yes   Patient tolerance:  Patient tolerated the procedure well with no immediate complications  Arthrogram demonstrated excellent flow of contrast throughout the joint surface without  extravasation or obvious defect.  The patient had relief of symptoms during the anesthetic phase of the injection.      No notes on file   Clinical History: No specialty comments available.  She reports that  has never smoked. she has never used smokeless tobacco. No results for input(s): HGBA1C, LABURIC in the last 8760 hours.  Objective:  VS:  HT:    WT:   BMI:     BP:127/69  HR:61bpm  TEMP: ( )  RESP:  Physical Exam  Musculoskeletal:  Painful range of motion of the right hip.    Ortho Exam Imaging: No results found.  Past Medical/Family/Surgical/Social History: Medications & Allergies reviewed per EMR Patient Active Problem List   Diagnosis Date Noted  . Unilateral primary osteoarthritis, right hip 09/11/2017  . Pain in right hip 09/11/2017   Past Medical History:  Diagnosis Date  . Arthritis   . Hypertension    No family history on file. No past surgical history on file. Social History   Occupational History  . Not on file  Tobacco Use  . Smoking status: Never Smoker  . Smokeless tobacco: Never Used  Substance and Sexual Activity  . Alcohol use: No  . Drug use: No  . Sexual activity: Not on file

## 2017-10-16 ENCOUNTER — Telehealth (INDEPENDENT_AMBULATORY_CARE_PROVIDER_SITE_OTHER): Payer: Self-pay | Admitting: Radiology

## 2017-10-16 NOTE — Telephone Encounter (Signed)
Patient called and said that night after her injection she started with a headache, at the top of her head.  It is not resolving.  She has been taking goody's powders, wants to talk to you guys about it.  [phone]

## 2017-10-16 NOTE — Telephone Encounter (Signed)
Right hip injection 10/25. Please advise.

## 2017-10-17 NOTE — Telephone Encounter (Signed)
Called patient and left message advising

## 2017-10-17 NOTE — Telephone Encounter (Signed)
There is no direct method for a headache from a could have gotten a headache that was started because of a cortisone medication being a trigger.  Use fluids.  If this persists she should see her primary care physician.

## 2017-10-21 MED ORDER — TRIAMCINOLONE ACETONIDE 40 MG/ML IJ SUSP
80.0000 mg | INTRAMUSCULAR | Status: AC | PRN
  Administered 2017-10-10: 80 mg via INTRA_ARTICULAR

## 2017-10-21 MED ORDER — BUPIVACAINE HCL 0.5 % IJ SOLN
3.0000 mL | INTRAMUSCULAR | Status: AC | PRN
  Administered 2017-10-10: 3 mL via INTRA_ARTICULAR

## 2017-10-24 ENCOUNTER — Ambulatory Visit (INDEPENDENT_AMBULATORY_CARE_PROVIDER_SITE_OTHER): Payer: Medicaid Other | Admitting: Orthopaedic Surgery

## 2017-10-24 DIAGNOSIS — M1611 Unilateral primary osteoarthritis, right hip: Secondary | ICD-10-CM | POA: Diagnosis not present

## 2017-10-24 NOTE — Progress Notes (Signed)
The patient is a morbidly obese 56 year old is following up after an intra-articular steroid injection the right hip.  Said the injection was helpful and she still has some soreness but overall she is doing well.  She does report that her hands in terms of her fingers and toes do curl up at times.  She is morbidly obese individual that does take a diuretic as a blood pressure medication and does take potassium.  She is not a diabetic as far she knows.  On exam she easily lets me put her right hip to internal and external rotation with minimal discomfort.  She is a significant amount of both obesity around this area.  Examination of her hands and feet showed no abnormalities that I can see.  Encouraged her to see her primary care physician again at some point to have a blood glucose and electrolytes checked.  From an orthopedic standpoint I would not recommend a hip replacement until she lost significant weight and work on her medical status including her teeth.  She will follow-up as needed but understands she can always get another repeat injection sometime in the spring if needed.

## 2018-02-24 ENCOUNTER — Ambulatory Visit (INDEPENDENT_AMBULATORY_CARE_PROVIDER_SITE_OTHER): Payer: Medicaid Other

## 2018-02-24 ENCOUNTER — Encounter (HOSPITAL_COMMUNITY): Payer: Self-pay | Admitting: Emergency Medicine

## 2018-02-24 ENCOUNTER — Ambulatory Visit (HOSPITAL_COMMUNITY)
Admission: EM | Admit: 2018-02-24 | Discharge: 2018-02-24 | Disposition: A | Discharge status: Home / Self Care | Payer: Medicaid Other | Attending: Family Medicine | Admitting: Family Medicine

## 2018-02-24 DIAGNOSIS — M7989 Other specified soft tissue disorders: Secondary | ICD-10-CM | POA: Diagnosis not present

## 2018-02-24 LAB — POCT I-STAT, CHEM 8
BUN: 7 mg/dL (ref 6–20)
CALCIUM ION: 1.2 mmol/L (ref 1.15–1.40)
CREATININE: 0.6 mg/dL (ref 0.44–1.00)
Chloride: 97 mmol/L — ABNORMAL LOW (ref 101–111)
GLUCOSE: 96 mg/dL (ref 65–99)
HCT: 34 % — ABNORMAL LOW (ref 36.0–46.0)
HEMOGLOBIN: 11.6 g/dL — AB (ref 12.0–15.0)
POTASSIUM: 3.4 mmol/L — AB (ref 3.5–5.1)
Sodium: 137 mmol/L (ref 135–145)
TCO2: 29 mmol/L (ref 22–32)

## 2018-02-24 LAB — POCT URINALYSIS DIP (DEVICE)
Bilirubin Urine: NEGATIVE
Glucose, UA: NEGATIVE mg/dL
HGB URINE DIPSTICK: NEGATIVE
Ketones, ur: NEGATIVE mg/dL
Leukocytes, UA: NEGATIVE
NITRITE: NEGATIVE
PH: 6.5 (ref 5.0–8.0)
Protein, ur: NEGATIVE mg/dL
SPECIFIC GRAVITY, URINE: 1.01 (ref 1.005–1.030)
UROBILINOGEN UA: 0.2 mg/dL (ref 0.0–1.0)

## 2018-02-24 MED ORDER — NAPROXEN 375 MG PO TABS: 375.0000 mg | ORAL_TABLET | Freq: Two times a day (BID) | ORAL | 0 refills | Status: DC

## 2018-02-24 NOTE — Discharge Instructions (Signed)
Your labs are reassuring today. Your chest xray is negative. Elevate your legs as able.  Light and regular activity. Please follow up with your primary care doctor for recheck in the next 1-2 weeks. If develop worsening of symptoms please return or go to ER for further evaluation.

## 2018-02-24 NOTE — ED Triage Notes (Signed)
Pt sts leg swelling, abd pain and dizziness

## 2018-02-24 NOTE — ED Provider Notes (Signed)
[facility]    CSN: [id number] Arrival date & time: 02/24/18  1553     History   Chief Complaint Chief Complaint  Patient presents with  . Dizziness  . Leg Swelling    HPI Lisa Macdonald is a 57 y.o. female.   Lisa Macdonald presents with complaints of bilateral thigh swelling which feels tight and heavy sensation. No known injury. Denies any previous similar. She is ambulatory. She states she does feel short of breath at times. Worse if laying flat. Occasional intermittent dizziness, worse with quick movements. Denies headache, nausea, vomiting, diarrhea, cough. States she has had urinary frequency, without pain. Denies any known cardiac history. Per chart review with hx htn, arthritis. She does take hctz and metoprolol daily.   ROS per HPI.       Past Medical History:  Diagnosis Date  . Arthritis   . Hypertension     Patient Active Problem List   Diagnosis Date Noted  . Unilateral primary osteoarthritis, right hip 09/11/2017  . Pain in right hip 09/11/2017    History reviewed. No pertinent surgical history.  OB History    No data available       Home Medications    Prior to Admission medications   Medication Sig Start Date End Date Taking? Authorizing Provider  celecoxib (CELEBREX) 200 MG capsule Take 200 mg by mouth daily as needed for mild pain.     [provider]  diclofenac (CATAFLAM) 50 MG tablet Take 1 tablet (50 mg total) by mouth 3 (three) times daily. One tablet TID with food prn pain. 08/27/15   Hayden Rasmussen, NP  hydrochlorothiazide (HYDRODIURIL) 25 MG tablet Take 25 mg by mouth every evening.     [provider]  HYDROcodone-acetaminophen (NORCO/VICODIN) 5-325 MG per tablet Take 1 tablet by mouth every 6 (six) hours as needed for moderate pain.    [provider]  loperamide (IMODIUM) 2 MG capsule Take 1 capsule (2 mg total) by mouth 4 (four) times daily as needed for diarrhea or loose stools. 02/13/14   Dione Booze,  MD  metoprolol (TOPROL-XL) 50 MG 24 hr tablet Take 50 mg by mouth every evening.     [provider]  omeprazole (PRILOSEC) 20 MG capsule Take 20 mg by mouth 2 (two) times daily.     [provider]  ondansetron (ZOFRAN) 4 MG tablet Take 1 tablet (4 mg total) by mouth every 6 (six) hours as needed for nausea or vomiting. 02/13/14   Dione Booze, MD  potassium chloride SA (K-DUR,KLOR-CON) 20 MEQ tablet Take 1 tablet (20 mEq total) by mouth daily. 02/13/14   Dione Booze, MD  sertraline (ZOLOFT) 100 MG tablet Take 100 mg by mouth every evening.     [provider]    Family History History reviewed. No pertinent family history.  Social History Social History   Tobacco Use  . Smoking status: Never Smoker  . Smokeless tobacco: Never Used  Substance Use Topics  . Alcohol use: No  . Drug use: No     Allergies   Patient has no known allergies.   Review of Systems Review of Systems   Physical Exam Triage Vital Signs ED Triage Vitals  Enc Vitals Group     BP 02/24/18 1732 107/83     Pulse Rate 02/24/18 1732 70     Resp 02/24/18 1732 18     Temp 02/24/18 1732 98.1 F (36.7 C)     Temp Source 02/24/18 1732 Oral  SpO2 02/24/18 1732 100 %     Weight --      Height --      Head Circumference --      Peak Flow --      Pain Score 02/24/18 1733 8     Pain Loc --      Pain Edu? --      Excl. in GC? --    No data found.  Updated Vital Signs BP 107/83 (BP Location: Right Arm)   Pulse 70   Temp 98.1 F (36.7 C) (Oral)   Resp 18   SpO2 100%   Visual Acuity Right Eye Distance:   Left Eye Distance:   Bilateral Distance:    Right Eye Near:   Left Eye Near:    Bilateral Near:     Physical Exam  Constitutional: She is oriented to person, place, and time. She appears well-developed and well-nourished. No distress.  Cardiovascular: Normal rate, regular rhythm and normal heart sounds.  Pulmonary/Chest: Effort normal and breath sounds normal. No  stridor. No respiratory distress. She has no wheezes. She has no rales. She exhibits no tenderness.  Musculoskeletal:  Bilateral thighs with subjective edema, difficult to assess due to large body habitus, non pitting; without redness, firmness; non tender; ambulatory; sensation intact; without swelling or tenderness to lower legs  Neurological: She is alert and oriented to person, place, and time. No cranial nerve deficit. Coordination normal.  Skin: Skin is warm and dry.     UC Treatments / Results  Labs (all labs ordered are listed, but only abnormal results are displayed) Labs Reviewed  POCT I-STAT, CHEM 8 - Abnormal; Notable for the following components:      Result Value   Potassium 3.4 (*)    Chloride 97 (*)    Hemoglobin 11.6 (*)    HCT 34.0 (*)    All other components within normal limits  POCT URINALYSIS DIP (DEVICE)    EKG  EKG Interpretation None       Radiology Dg Chest 2 View  Result Date: 02/24/2018 CLINICAL DATA:  Shortness of breath for 1 week EXAM: CHEST - 2 VIEW COMPARISON:  06/23/2011 FINDINGS: The heart size and mediastinal contours are within normal limits. Both lungs are clear. The visualized skeletal structures are unremarkable. IMPRESSION: No active cardiopulmonary disease. Electronically Signed   By: Alcide CleverMark  Lukens M.D.   On: 02/24/2018 19:19    Procedures Procedures (including critical care time)  Medications Ordered in UC Medications - No data to display   Initial Impression / Assessment and Plan / UC Course  I have reviewed the triage vital signs and the nursing notes.  Pertinent labs & imaging results that were available during my care of the patient were reviewed by me and considered in my medical decision making (see chart for details).     Chem 8 reassuring, hgb and hct unchanged from last labs 2/215. Creatinine WNL. Legs non pitting, without redness, tenderness. Lungs clear to auscultation bilaterally. Chest xray negative. Without  neurological findings on exam. Continue to monitor symptoms. Encouraged activity, elevation as able. Follow up for recheck in the next 1-2 weeks. Patient verbalized understanding and agreeable to plan.  Ambulatory out of clinic without difficulty.    Final Clinical Impressions(s) / UC Diagnoses   Final diagnoses:  Leg swelling    ED Discharge Orders    None       Controlled Substance Prescriptions [place] Controlled Substance Registry consulted? Not Applicable   Linus MakoBurky, Natalie  B, NP 02/24/18 1932

## 2018-03-23 ENCOUNTER — Ambulatory Visit (HOSPITAL_COMMUNITY)
Admission: EM | Admit: 2018-03-23 | Discharge: 2018-03-23 | Disposition: A | Discharge status: Home / Self Care | Payer: Medicaid Other | Attending: Physician Assistant | Admitting: Physician Assistant

## 2018-03-23 ENCOUNTER — Encounter (HOSPITAL_COMMUNITY): Payer: Self-pay | Admitting: Emergency Medicine

## 2018-03-23 DIAGNOSIS — R21 Rash and other nonspecific skin eruption: Secondary | ICD-10-CM | POA: Diagnosis not present

## 2018-03-23 MED ORDER — CETIRIZINE HCL 10 MG PO CAPS: 10.0000 mg | ORAL_CAPSULE | Freq: Every day | ORAL | 0 refills | Status: DC

## 2018-03-23 MED ORDER — PERMETHRIN 5 % EX CREA: TOPICAL_CREAM | CUTANEOUS | 1 refills | Status: DC

## 2018-03-23 MED ORDER — DESONIDE 0.05 % EX CREA: TOPICAL_CREAM | Freq: Two times a day (BID) | CUTANEOUS | 0 refills | Status: DC

## 2018-03-23 NOTE — Discharge Instructions (Addendum)
Please apply the permethrin cream exactly as prescribed.  Take Zyrtec for itching and apply the desonide cream for itching as well.  Come back in about 2-3 weeks if you are not getting better as it can take time for the itching associated with this rash to completely go away.

## 2018-03-23 NOTE — ED Provider Notes (Signed)
03/23/2018 7:17 PM   DOB: 1961/06/10 / MRN: [id number]  SUBJECTIVE:  Lisa Macdonald is a 57 y.o. female presenting for rash on the bilateral arms, forearms, wrist, hands.  Patient tells me this started about a week ago and is intensely itchy and worse at night.  Tells me she has a new lesion on her hand that started this morning.  She denies involvement of the face.  She complains of bilateral thigh without swelling to the ankles.  This was worked up roughly 1 month ago and workup was negative at that time.  Patient reports the symptoms have not really been changing since that time.  She has No Known Allergies.   She  has a past medical history of Arthritis and Hypertension.    She  reports that she has never smoked. She has never used smokeless tobacco. She reports that she does not drink alcohol or use drugs. She  has no sexual activity history on file. The patient  has no past surgical history on file.  Her family history is not on file.  Review of Systems  Constitutional: Negative for chills, diaphoresis and fever.  Gastrointestinal: Negative for nausea.  Skin: Positive for itching and rash.  Neurological: Negative for dizziness.    OBJECTIVE:  BP (!) 167/73 (BP Location: Right Arm)   Pulse 73   Temp 97.9 F (36.6 C) (Oral)   Resp 18   SpO2 98%   Wt Readings from Last 3 Encounters:  08/08/16 220 lb (99.8 kg)  06/29/13 270 lb (122.5 kg)   Temp Readings from Last 3 Encounters:  03/23/18 97.9 F (36.6 C) (Oral)  02/24/18 98.1 F (36.7 C) (Oral)  08/08/16 97.5 F (36.4 C) (Oral)   BP Readings from Last 3 Encounters:  03/23/18 (!) 167/73  02/24/18 107/83  10/10/17 127/69   Pulse Readings from Last 3 Encounters:  03/23/18 73  02/24/18 70  10/10/17 61   Physical Exam  Constitutional: She is active.  Non-toxic appearance.  Cardiovascular: Normal rate and regular rhythm.  Pulmonary/Chest: Effort normal and breath sounds normal. No tachypnea.  Musculoskeletal: She  exhibits no edema, tenderness or deformity.  Bilateral thighs negative for tenderness and edema.   Neurological: She is alert.  Skin: Skin is warm and dry. Rash (Papular sporadic rash present about the hands, forearms, shoulders.  Several with excoriation.  Negative for tenderness.) noted. She is not diaphoretic. No pallor.    No results found for this or any previous visit (from the past 72 hour(s)).  No results found.  ASSESSMENT AND PLAN:    Rash: There is no involvement of the face.  It appears that something is biting her and I think scabies is very likely.  I will treat to that hand.  Of advised that she come back in 2-3 weeks she is not getting better.  Advised it may take some time to see symptoms resolve.      The patient is advised to call or return to clinic if she does not see an improvement in symptoms, or to seek the care of the closest emergency department if she worsens with the above plan.   Deliah BostonMichael Clark, MHS, PA-C 03/23/2018 7:17 PM    Ofilia Neaslark, Michael L, PA-C 03/23/18 1920

## 2018-03-23 NOTE — ED Triage Notes (Signed)
Pt sts leg swelling and rash with itching

## 2018-07-03 ENCOUNTER — Encounter: Payer: Self-pay | Admitting: Obstetrics

## 2018-07-03 ENCOUNTER — Other Ambulatory Visit (HOSPITAL_COMMUNITY)
Admission: RE | Admit: 2018-07-03 | Discharge: 2018-07-03 | Disposition: A | Discharge status: Home / Self Care | Payer: Medicaid Other | Source: Ambulatory Visit | Attending: Obstetrics | Admitting: Obstetrics

## 2018-07-03 ENCOUNTER — Ambulatory Visit (INDEPENDENT_AMBULATORY_CARE_PROVIDER_SITE_OTHER): Payer: Medicaid Other | Admitting: Obstetrics

## 2018-07-03 ENCOUNTER — Ambulatory Visit: Payer: Medicaid Other | Admitting: Obstetrics

## 2018-07-03 VITALS — BP 141/93 | HR 73 | Wt 263.1 lb

## 2018-07-03 DIAGNOSIS — N898 Other specified noninflammatory disorders of vagina: Secondary | ICD-10-CM

## 2018-07-03 DIAGNOSIS — Z1239 Encounter for other screening for malignant neoplasm of breast: Secondary | ICD-10-CM

## 2018-07-03 DIAGNOSIS — R350 Frequency of micturition: Secondary | ICD-10-CM

## 2018-07-03 DIAGNOSIS — Z6841 Body Mass Index (BMI) 40.0 and over, adult: Secondary | ICD-10-CM

## 2018-07-03 DIAGNOSIS — Z0000 Encounter for general adult medical examination without abnormal findings: Secondary | ICD-10-CM

## 2018-07-03 DIAGNOSIS — E66813 Obesity, class 3: Secondary | ICD-10-CM

## 2018-07-03 DIAGNOSIS — Z124 Encounter for screening for malignant neoplasm of cervix: Secondary | ICD-10-CM | POA: Diagnosis not present

## 2018-07-03 DIAGNOSIS — Z01419 Encounter for gynecological examination (general) (routine) without abnormal findings: Secondary | ICD-10-CM | POA: Diagnosis not present

## 2018-07-03 LAB — POCT URINALYSIS DIPSTICK
BILIRUBIN UA: NEGATIVE
GLUCOSE UA: NEGATIVE
KETONES UA: NEGATIVE
Nitrite, UA: NEGATIVE
Protein, UA: NEGATIVE
RBC UA: NEGATIVE
SPEC GRAV UA: 1.01 (ref 1.010–1.025)
Urobilinogen, UA: 0.2 E.U./dL
pH, UA: 6 (ref 5.0–8.0)

## 2018-07-03 NOTE — Progress Notes (Signed)
Subjective:        Lisa Macdonald is a 57 y.o. female here for a routine exam.  Current complaints: Urinary frequency.  Personal health questionnaire:  Is patient Ashkenazi Jewish, have a family history of breast and/or ovarian cancer: no Is there a family history of uterine cancer diagnosed at age < 6350, gastrointestinal cancer, urinary tract cancer, family member who is a Personnel officerLynch syndrome-associated carrier: no Is the patient overweight and hypertensive, family history of diabetes, personal history of gestational diabetes, preeclampsia or PCOS: no Is patient over 7655, have PCOS,  family history of premature CHD under age 57, diabetes, smoke, have hypertension or peripheral artery disease:  no At any time, has a partner hit, kicked or otherwise hurt or frightened you?: no Over the past 2 weeks, have you felt down, depressed or hopeless?: no Over the past 2 weeks, have you felt little interest or pleasure in doing things?:no   Gynecologic History No LMP recorded. Patient is postmenopausal. Contraception: post menopausal status Last Pap: unknown. Results were: unknown Last mamm2017ogram: . Results were: normal  Obstetric History OB History  Gravida Para Term Preterm AB Living  3         3  SAB TAB Ectopic Multiple Live Births               # Outcome Date GA Lbr Len/2nd Weight Sex Delivery Anes PTL Lv  3 Gravida           2 Gravida           1 [place]Gravida             Past Medical History:  Diagnosis Date  . Arthritis   . Hypertension     History reviewed. No pertinent surgical history.   Current Outpatient Medications:  .  Cetirizine HCl (ZYRTEC ALLERGY) 10 MG CAPS, Take 1 capsule (10 mg total) by mouth daily., Disp: 10 capsule, Rfl: 0 .  hydrochlorothiazide (HYDRODIURIL) 25 MG tablet, Take 25 mg by mouth every evening. , Disp: , Rfl:  .  metoprolol (TOPROL-XL) 50 MG 24 hr tablet, Take 50 mg by mouth every evening. , Disp: , Rfl:  .  omeprazole (PRILOSEC) 20 MG capsule, Take  20 mg by mouth 2 (two) times daily. , Disp: , Rfl:  .  ondansetron (ZOFRAN) 4 MG tablet, Take 1 tablet (4 mg total) by mouth every 6 (six) hours as needed for nausea or vomiting., Disp: 20 tablet, Rfl: 0 .  potassium chloride SA (K-DUR,KLOR-CON) 20 MEQ tablet, Take 1 tablet (20 mEq total) by mouth daily., Disp: 60 tablet, Rfl: 0 .  desonide (DESOWEN) 0.05 % cream, Apply topically 2 (two) times daily. (Patient not taking: Reported on 07/03/2018), Disp: 30 g, Rfl: 0 .  HYDROcodone-acetaminophen (NORCO/VICODIN) 5-325 MG per tablet, Take 1 tablet by mouth every 6 (six) hours as needed for moderate pain., Disp: , Rfl:  .  loperamide (IMODIUM) 2 MG capsule, Take 1 capsule (2 mg total) by mouth 4 (four) times daily as needed for diarrhea or loose stools. (Patient not taking: Reported on 07/03/2018), Disp: 12 capsule, Rfl: 0 .  naproxen (NAPROSYN) 375 MG tablet, Take 1 tablet (375 mg total) by mouth 2 (two) times daily. (Patient not taking: Reported on 07/03/2018), Disp: 20 tablet, Rfl: 0 .  permethrin (ELIMITE) 5 % cream, Apply from the neck down and leave on for 12 hours.  Reapply after 1 week. (Patient not taking: Reported on 07/03/2018), Disp: 60 g, Rfl: 1 .  sertraline (ZOLOFT) 100 MG tablet, Take 100 mg by mouth every evening. , Disp: , Rfl:  No Known Allergies  Social History   Tobacco Use  . Smoking status: Current Some Day Smoker  . Smokeless tobacco: Never Used  Substance Use Topics  . Alcohol use: No    History reviewed. No pertinent family history.    Review of Systems  Constitutional: negative for fatigue and weight loss Respiratory: negative for cough and wheezing Cardiovascular: negative for chest pain, fatigue and palpitations Gastrointestinal: negative for abdominal pain and change in bowel habits Musculoskeletal:negative for myalgias Neurological: negative for gait problems and tremors Behavioral/Psych: negative for abusive relationship, depression Endocrine: negative for  temperature intolerance    Genitourinary:POSITIVE for urinary frequency Integument/breast: negative for breast lump, breast tenderness, nipple discharge and skin lesion(s)    Objective:       BP (!) 141/93   Pulse 73   Wt 263 lb 1.6 oz (119.3 kg)   BMI 43.12 kg/m  General:   alert  Skin:   no rash or abnormalities  Lungs:   clear to auscultation bilaterally  Heart:   regular rate and rhythm, S1, S2 normal, no murmur, click, rub or gallop  Breasts:   normal without suspicious masses, skin or nipple changes or axillary nodes  Abdomen:  normal findings: no organomegaly, soft, non-tender and no hernia  Pelvis:  External genitalia: normal general appearance Urinary system: urethral meatus normal and bladder without fullness, nontender Vaginal: normal without tenderness, induration or masses Cervix: normal appearance Adnexa: normal bimanual exam Uterus: anteverted and non-tender, normal size   Lab Review Urine pregnancy test Labs reviewed yes Radiologic studies reviewed yes  50% of 20 min visit spent on counseling and coordination of care.   Assessment:     1. Encounter for annual routine gynecological examination  2. Screening for cervical cancer Rx: - Cytology - PAP  3. Vaginal discharge Rx: - Cervicovaginal ancillary only  4. Frequent urination Rx: - POCT Urinalysis Dipstick - Urine Culture  5. Routine adult health maintenance Rx: - Comprehensive metabolic panel - TSH - Hemoglobin A1c - CBC  6. Screening breast examination Rx: - MM Digital Screening; Future  7. Class 3 severe obesity due to excess calories without serious comorbidity with body mass index (BMI) of 40.0 to 44.9 in adult [facility]) - program of caloric reduction, exercise and behavioral modification recommended     Plan:    Education reviewed: calcium supplements, depression evaluation, low fat, low cholesterol diet, safe sex/STD prevention, self breast exams and weight bearing  exercise. Mammogram ordered. Follow up in: 1 year.   No orders of the defined types were placed in this encounter.  Orders Placed This Encounter  Procedures  . Urine Culture  . MM Digital Screening    Standing Status:   Future    Standing Expiration Date:   09/04/2019    Order Specific Question:   Reason for Exam (SYMPTOM  OR DIAGNOSIS REQUIRED)    Answer:   Screening    Order Specific Question:   Is the patient pregnant?    Answer:   No    Order Specific Question:   Preferred imaging location?    Answer:   [facility]  . Comprehensive metabolic panel  . TSH  . Hemoglobin A1c  . CBC  . POCT Urinalysis Dipstick    Brock Bad MD 07-03-2018

## 2018-07-03 NOTE — Progress Notes (Signed)
NGYN Patient presents for Annual Exam today. Pt wants Annual Labs drawn HBGA1C, Vitamin D,cholesteral etc.   LMP: Menopause  Last pap:? Mammogram: 2017 WNL  Colonoscopy: age 57  CC: Frequent urination , pelvic pain and pressure 8/10x. Pt notes in legs worse and night.

## 2018-07-04 ENCOUNTER — Other Ambulatory Visit: Payer: Self-pay

## 2018-07-04 DIAGNOSIS — Z0000 Encounter for general adult medical examination without abnormal findings: Secondary | ICD-10-CM

## 2018-07-04 LAB — CBC
HEMOGLOBIN: 10.7 g/dL — AB (ref 11.1–15.9)
Hematocrit: 34 % (ref 34.0–46.6)
MCH: 24 pg — AB (ref 26.6–33.0)
MCHC: 31.5 g/dL (ref 31.5–35.7)
MCV: 76 fL — ABNORMAL LOW (ref 79–97)
PLATELETS: 384 10*3/uL (ref 150–450)
RBC: 4.45 x10E6/uL (ref 3.77–5.28)
RDW: 14.4 % (ref 12.3–15.4)
WBC: 7.2 10*3/uL (ref 3.4–10.8)

## 2018-07-04 LAB — COMPREHENSIVE METABOLIC PANEL
A/G RATIO: 1.1 — AB (ref 1.2–2.2)
ALK PHOS: 120 IU/L — AB (ref 39–117)
ALT: 12 IU/L (ref 0–32)
AST: 14 IU/L (ref 0–40)
Albumin: 4 g/dL (ref 3.5–5.5)
BILIRUBIN TOTAL: 0.3 mg/dL (ref 0.0–1.2)
BUN/Creatinine Ratio: 10 (ref 9–23)
BUN: 6 mg/dL (ref 6–24)
CALCIUM: 9.4 mg/dL (ref 8.7–10.2)
CHLORIDE: 99 mmol/L (ref 96–106)
CO2: 25 mmol/L (ref 20–29)
Creatinine, Ser: 0.63 mg/dL (ref 0.57–1.00)
GFR calc Af Amer: 115 mL/min/{1.73_m2} (ref >59)
GFR, EST NON AFRICAN AMERICAN: 100 mL/min/{1.73_m2} (ref >59)
GLOBULIN, TOTAL: 3.5 g/dL (ref 1.5–4.5)
Glucose: 84 mg/dL (ref 65–99)
POTASSIUM: 3.9 mmol/L (ref 3.5–5.2)
Sodium: 137 mmol/L (ref 134–144)
Total Protein: 7.5 g/dL (ref 6.0–8.5)

## 2018-07-04 LAB — CERVICOVAGINAL ANCILLARY ONLY
BACTERIAL VAGINITIS: NEGATIVE
CHLAMYDIA, DNA PROBE: NEGATIVE
Candida vaginitis: NEGATIVE
NEISSERIA GONORRHEA: NEGATIVE
TRICH (WINDOWPATH): NEGATIVE

## 2018-07-04 LAB — HEMOGLOBIN A1C
Est. average glucose Bld gHb Est-mCnc: 120 mg/dL
HEMOGLOBIN A1C: 5.8 % — AB (ref 4.8–5.6)

## 2018-07-04 LAB — TSH: TSH: 3.18 u[IU]/mL (ref 0.450–4.500)

## 2018-07-05 LAB — URINE CULTURE

## 2018-07-06 ENCOUNTER — Other Ambulatory Visit: Payer: Self-pay | Admitting: Obstetrics

## 2018-07-06 DIAGNOSIS — N3000 Acute cystitis without hematuria: Secondary | ICD-10-CM

## 2018-07-06 MED ORDER — NITROFURANTOIN MONOHYD MACRO 100 MG PO CAPS
100.0000 mg | ORAL_CAPSULE | Freq: Two times a day (BID) | ORAL | 0 refills | Status: DC
Start: 2018-07-06 — End: 2021-12-25

## 2018-07-07 ENCOUNTER — Other Ambulatory Visit: Payer: Self-pay | Admitting: Obstetrics

## 2018-07-07 ENCOUNTER — Other Ambulatory Visit: Payer: Self-pay

## 2018-07-07 DIAGNOSIS — Z3042 Encounter for surveillance of injectable contraceptive: Secondary | ICD-10-CM

## 2018-07-07 LAB — CYTOLOGY - PAP
DIAGNOSIS: NEGATIVE
HPV (WINDOPATH): NOT DETECTED

## 2018-07-07 NOTE — Progress Notes (Signed)
Error

## 2018-07-15 ENCOUNTER — Telehealth: Payer: Self-pay

## 2018-07-15 NOTE — Telephone Encounter (Signed)
Returned call and answered pt's questions about referral and results.

## 2018-07-19 ENCOUNTER — Other Ambulatory Visit: Payer: Self-pay | Admitting: Internal Medicine

## 2018-07-19 DIAGNOSIS — E2839 Other primary ovarian failure: Secondary | ICD-10-CM

## 2018-07-24 ENCOUNTER — Ambulatory Visit
Admission: RE | Admit: 2018-07-24 | Discharge: 2018-07-24 | Disposition: A | Discharge status: Home / Self Care | Payer: Medicaid Other | Source: Ambulatory Visit | Attending: Obstetrics | Admitting: Obstetrics

## 2018-07-24 DIAGNOSIS — Z1239 Encounter for other screening for malignant neoplasm of breast: Secondary | ICD-10-CM

## 2018-07-28 ENCOUNTER — Telehealth: Payer: Self-pay

## 2018-07-28 NOTE — Telephone Encounter (Signed)
TC from pt stating she never received a call regarding referral for PCP.   Please advise.

## 2018-08-04 ENCOUNTER — Ambulatory Visit (INDEPENDENT_AMBULATORY_CARE_PROVIDER_SITE_OTHER): Payer: Medicaid Other | Admitting: Orthopaedic Surgery

## 2018-08-04 ENCOUNTER — Encounter (INDEPENDENT_AMBULATORY_CARE_PROVIDER_SITE_OTHER): Payer: Self-pay | Admitting: Orthopaedic Surgery

## 2018-08-04 ENCOUNTER — Ambulatory Visit (INDEPENDENT_AMBULATORY_CARE_PROVIDER_SITE_OTHER): Payer: Self-pay

## 2018-08-04 DIAGNOSIS — M25551 Pain in right hip: Secondary | ICD-10-CM | POA: Diagnosis not present

## 2018-08-04 DIAGNOSIS — M1611 Unilateral primary osteoarthritis, right hip: Secondary | ICD-10-CM

## 2018-08-04 NOTE — Progress Notes (Signed)
Office Visit Note   Patient: Lisa BreechDarlena Tesoriero           Date of Birth: 11-01-1961           MRN: [id number] Visit Date: 08/04/2018              Requested by: Fleet ContrasAvbuere, Edwin, MD [address] [place], [place] [id number] PCP: Fleet ContrasAvbuere, Edwin, MD   Assessment & Plan: Visit Diagnoses:  1. Pain in right hip   2. Unilateral primary osteoarthritis, right hip     Plan: She understands that her arthritis is slowly worsening in her right hip.  I agree with her trying another intra-articular injection since he is getting close to 2 years since her last injection.  We will set this up to Dr. Alvester MorinNewton.  I would like to see her back in 2 months to see how she is doing overall.  I do feel that she is eventually candidate for hip replacement surgery but would like her to lose weight however I feel comfortable based on her clinical exam of being able to get to her hip easily from my standpoint surgically.  Follow-Up Instructions: Return in about 2 months (around 10/04/2018).   Orders:  Orders Placed This Encounter  Procedures  . XR HIP UNILAT W OR W/O PELVIS 1V RIGHT   No orders of the defined types were placed in this encounter.     Procedures: No procedures performed   Clinical Data: No additional findings.   Subjective: Chief Complaint  Patient presents with  . Right Hip - Pain  The patient is someone about she is seen before.  She is a very pleasant 57 year old female who is morbidly obese and has a history of severe right hip pain with known arthritis.  She actually had a intra-articular steroid injection in the right hip by Dr. Alvester MorinNewton under direct fluoroscopy in October 2018.  She said this worked greatly and would like to consider another injection before considering any surgery.  She is tried to work on weight loss.  Her BMI is 43.  She is not on any diabetic medications and reports not being a diabetic.  She is worked on activity modification.  She is trying to lose weight but  unsuccessful with that.  She does not smoke.  She does not walk with assistive device.  HPI  Review of Systems She currently denies any headache, chest pain, shortness of breath, fever, chills, nausea, vomiting.  Objective: Vital Signs: There were no vitals taken for this visit.  Physical Exam She is alert and oriented x3 and in no acute distress Ortho Exam Examination of her left hip is normal examination of her right hip she has pain in the thigh and the groin with internal and external rotation.  I did lay her in a supine position on the exam table and was able to mobilize the soft tissue around her hip.  I feel comfortable with performing hip replacement surgery if warranted. Specialty Comments:  No specialty comments available.  Imaging: Xr Hip Unilat W Or W/o Pelvis 1v Right  Result Date: 08/04/2018 An AP pelvis and lateral of the right hip shows severe arthritic changes of the right hip.  There is significant joint space narrowing as well as para-articular osteophytes and cystic changes.    PMFS History: Patient Active Problem List   Diagnosis Date Noted  . Unilateral primary osteoarthritis, right hip 09/11/2017  . Pain in right hip 09/11/2017   Past Medical History:  Diagnosis Date  .  Arthritis   . Hypertension     History reviewed. No pertinent family history.  History reviewed. No pertinent surgical history. Social History   Occupational History  . Not on file  Tobacco Use  . Smoking status: Current Some Day Smoker  . Smokeless tobacco: Never Used  Substance and Sexual Activity  . Alcohol use: No  . Drug use: No  . Sexual activity: Not Currently    Partners: Male    Birth control/protection: Post-menopausal

## 2018-08-05 ENCOUNTER — Other Ambulatory Visit (INDEPENDENT_AMBULATORY_CARE_PROVIDER_SITE_OTHER): Payer: Self-pay

## 2018-08-05 DIAGNOSIS — M25551 Pain in right hip: Secondary | ICD-10-CM

## 2018-08-15 ENCOUNTER — Telehealth: Payer: Self-pay

## 2018-08-15 NOTE — Telephone Encounter (Signed)
TC from pt stating she still has not heard anything regarding PCP referral Message sent to scheduling.

## 2018-08-22 ENCOUNTER — Ambulatory Visit (INDEPENDENT_AMBULATORY_CARE_PROVIDER_SITE_OTHER): Payer: Medicaid Other

## 2018-08-22 ENCOUNTER — Encounter (INDEPENDENT_AMBULATORY_CARE_PROVIDER_SITE_OTHER): Payer: Self-pay | Admitting: Physical Medicine and Rehabilitation

## 2018-08-22 ENCOUNTER — Ambulatory Visit (INDEPENDENT_AMBULATORY_CARE_PROVIDER_SITE_OTHER): Payer: Medicaid Other | Admitting: Physical Medicine and Rehabilitation

## 2018-08-22 DIAGNOSIS — M25551 Pain in right hip: Secondary | ICD-10-CM

## 2018-08-22 NOTE — Progress Notes (Signed)
   Lisa Macdonald - 57 y.o. female MRN [id number]  Date of birth: October 09, 1961  Office Visit Note: Visit Date: 08/22/2018 PCP: Fleet Contras, MD Referred by: Fleet Contras, MD  Subjective: Chief Complaint  Patient presents with  . Right Hip - Pain  . Right Thigh - Pain   HPI: Lisa Macdonald is a 57 year old female that comes in today at the request of Dr. Doneen Poisson for diagnostic and therapeutic right hip anesthetic hip arthrogram.  She had a prior injection over 2 years ago with relief.  She is having chronic worsening right hip and groin pain.  This is been ongoing for 5 years.  No new trauma.   ROS Otherwise per HPI.  Assessment & Plan: Visit Diagnoses:  1. Pain in right hip     Plan: Findings:  Right hip anesthetic arthrogram with good relief during the anesthetic phase.    Meds & Orders: No orders of the defined types were placed in this encounter.   Orders Placed This Encounter  Procedures  . Large Joint Inj: R hip joint  . XR C-ARM NO REPORT    Follow-up: Return for Dr. Magnus Ivan.   Procedures: Large Joint Inj: R hip joint on 08/22/2018 2:20 PM Indications: pain and diagnostic evaluation Details: 22 G needle, anterior approach  Arthrogram: Yes  Medications: 80 mg triamcinolone acetonide 40 MG/ML; 3 mL bupivacaine 0.5 % Outcome: tolerated well, no immediate complications  Arthrogram demonstrated excellent flow of contrast throughout the joint surface without extravasation or obvious defect.  The patient had relief of symptoms during the anesthetic phase of the injection.  Procedure, treatment alternatives, risks and benefits explained, specific risks discussed. Consent was given by the patient. Immediately prior to procedure a time out was called to verify the correct patient, procedure, equipment, support staff and site/side marked as required. Patient was prepped and draped in the usual sterile fashion.      No notes on file   Clinical History: No  specialty comments available.   She reports that she has been smoking. She has never used smokeless tobacco.  Recent Labs    07/03/18 1552  HGBA1C 5.8*    Objective:  VS:  HT:    WT:   BMI:     BP:   HR: bpm  TEMP: ( )  RESP:  Physical Exam  Ortho Exam Imaging: No results found.  Past Medical/Family/Surgical/Social History: Medications & Allergies reviewed per EMR, new medications updated. Patient Active Problem List   Diagnosis Date Noted  . Unilateral primary osteoarthritis, right hip 09/11/2017  . Pain in right hip 09/11/2017   Past Medical History:  Diagnosis Date  . Arthritis   . Hypertension    History reviewed. No pertinent family history. History reviewed. No pertinent surgical history. Social History   Occupational History  . Not on file  Tobacco Use  . Smoking status: Current Some Day Smoker  . Smokeless tobacco: Never Used  Substance and Sexual Activity  . Alcohol use: No  . Drug use: No  . Sexual activity: Not Currently    Partners: Male    Birth control/protection: Post-menopausal

## 2018-08-22 NOTE — Progress Notes (Signed)
 .  Numeric Pain Rating Scale and Functional Assessment Average Pain 10   In the last MONTH (on 0-10 scale) has pain interfered with the following?  1. General activity like being  able to carry out your everyday physical activities such as walking, climbing stairs, carrying groceries, or moving a chair?  Rating(8)   -Dye Allergies.  

## 2018-08-22 NOTE — Patient Instructions (Signed)

## 2018-09-10 ENCOUNTER — Ambulatory Visit
Admission: RE | Admit: 2018-09-10 | Discharge: 2018-09-10 | Disposition: A | Discharge status: Home / Self Care | Payer: Medicaid Other | Source: Ambulatory Visit | Attending: Internal Medicine | Admitting: Internal Medicine

## 2018-09-10 DIAGNOSIS — E2839 Other primary ovarian failure: Secondary | ICD-10-CM

## 2018-09-10 MED ORDER — BUPIVACAINE HCL 0.5 % IJ SOLN
3.0000 mL | INTRAMUSCULAR | Status: AC | PRN
  Administered 2018-08-22: 3 mL via INTRA_ARTICULAR

## 2018-09-10 MED ORDER — TRIAMCINOLONE ACETONIDE 40 MG/ML IJ SUSP
80.0000 mg | INTRAMUSCULAR | Status: AC | PRN
  Administered 2018-08-22: 80 mg via INTRA_ARTICULAR

## 2018-10-02 ENCOUNTER — Ambulatory Visit (INDEPENDENT_AMBULATORY_CARE_PROVIDER_SITE_OTHER): Payer: Medicaid Other | Admitting: Orthopaedic Surgery

## 2018-10-02 ENCOUNTER — Encounter (INDEPENDENT_AMBULATORY_CARE_PROVIDER_SITE_OTHER): Payer: Self-pay | Admitting: Orthopaedic Surgery

## 2018-10-02 ENCOUNTER — Encounter (INDEPENDENT_AMBULATORY_CARE_PROVIDER_SITE_OTHER): Payer: Self-pay

## 2018-10-02 DIAGNOSIS — M1611 Unilateral primary osteoarthritis, right hip: Secondary | ICD-10-CM | POA: Diagnosis not present

## 2018-10-02 DIAGNOSIS — M25551 Pain in right hip: Secondary | ICD-10-CM | POA: Diagnosis not present

## 2018-10-02 MED ORDER — TRAMADOL HCL 50 MG PO TABS: 50.0000 mg | ORAL_TABLET | Freq: Four times a day (QID) | ORAL | 0 refills | Status: DC | PRN

## 2018-10-02 NOTE — Progress Notes (Signed)
The patient is a morbidly obese individual with a BMI over 40.  He has severe arthritis of her right hip.  She had an intra-articular injection by Dr. Alvester Morin about 2 months ago.  She stated that the injection was not really helpful.  She has pain in the groin with internal and external rotation.  She has known severe arthritis in her right hip.  However her weight is limiting [name] in terms of surgery.  She is a diabetic as well.  I counseled her about considering bariatric nutritional counseling and even bariatric surgery.  I am at a loss of what else that I can do for her given where her weight is around her body that prohibits her from having hip replacement surgery until she loses a significant amount of weight.  I will send in a one-time prescription for tramadol for her and I recommended a cane at least in her opposite hand.The patient meets the AMA guidelines for Morbid (severe) obesity with a BMI > 40.0 and I have recommended weight loss.

## 2019-01-11 ENCOUNTER — Other Ambulatory Visit: Payer: Self-pay

## 2019-01-11 ENCOUNTER — Emergency Department (HOSPITAL_BASED_OUTPATIENT_CLINIC_OR_DEPARTMENT_OTHER)
Admit: 2019-01-11 | Discharge: 2019-01-11 | Disposition: A | Discharge status: Home / Self Care | Payer: Medicaid Other | Attending: Emergency Medicine | Admitting: Emergency Medicine

## 2019-01-11 ENCOUNTER — Encounter (HOSPITAL_COMMUNITY): Payer: Self-pay

## 2019-01-11 ENCOUNTER — Emergency Department (HOSPITAL_COMMUNITY)
Admission: EM | Admit: 2019-01-11 | Discharge: 2019-01-11 | Disposition: A | Discharge status: Home / Self Care | Payer: Medicaid Other | Attending: Emergency Medicine | Admitting: Emergency Medicine

## 2019-01-11 DIAGNOSIS — F17200 Nicotine dependence, unspecified, uncomplicated: Secondary | ICD-10-CM | POA: Insufficient documentation

## 2019-01-11 DIAGNOSIS — M79651 Pain in right thigh: Secondary | ICD-10-CM | POA: Diagnosis not present

## 2019-01-11 DIAGNOSIS — B373 Candidiasis of vulva and vagina: Secondary | ICD-10-CM | POA: Diagnosis not present

## 2019-01-11 DIAGNOSIS — B3731 Acute candidiasis of vulva and vagina: Secondary | ICD-10-CM

## 2019-01-11 DIAGNOSIS — Z79899 Other long term (current) drug therapy: Secondary | ICD-10-CM | POA: Diagnosis not present

## 2019-01-11 DIAGNOSIS — I10 Essential (primary) hypertension: Secondary | ICD-10-CM | POA: Diagnosis not present

## 2019-01-11 LAB — URINALYSIS, ROUTINE W REFLEX MICROSCOPIC
Bilirubin Urine: NEGATIVE
Glucose, UA: NEGATIVE mg/dL
Hgb urine dipstick: NEGATIVE
KETONES UR: NEGATIVE mg/dL
LEUKOCYTES UA: NEGATIVE
NITRITE: NEGATIVE
PH: 6 (ref 5.0–8.0)
PROTEIN: NEGATIVE mg/dL
Specific Gravity, Urine: 1.004 — ABNORMAL LOW (ref 1.005–1.030)

## 2019-01-11 MED ORDER — NYSTATIN-TRIAMCINOLONE 100000-0.1 UNIT/GM-% EX CREA: TOPICAL_CREAM | CUTANEOUS | 0 refills | Status: DC

## 2019-01-11 NOTE — Discharge Instructions (Addendum)
Your study for blood clot was negative. Follow up with your doctor to discuss your symptoms.

## 2019-01-11 NOTE — ED Provider Notes (Signed)
[facility]-EMERGENCY DEPT Provider Note   CSN: [id number] Arrival date & time: 01/11/19  1617     History   Chief Complaint Chief Complaint  Patient presents with  . Leg Pain    HPI Lisa Macdonald is a 58 y.o. female with hx of osteoarthritis of rith hip and HTN who presents to the ED with right thigh pain. The pain started over a week ago and has gotten worse. Patient concerned that she may have a blood clot. No hx of DVT. Patient has not taken anything for pain. Patient also concerned that she may have a UTI.   HPI  Past Medical History:  Diagnosis Date  . Arthritis   . Hypertension     Patient Active Problem List   Diagnosis Date Noted  . Unilateral primary osteoarthritis, right hip 09/11/2017  . Pain in right hip 09/11/2017    History reviewed. No pertinent surgical history.   OB History    Gravida  3   Para      Term      Preterm      AB      Living  3     SAB      TAB      Ectopic      Multiple      Live Births               Home Medications    Prior to Admission medications   Medication Sig Start Date End Date Taking? Authorizing Provider  Cetirizine HCl (ZYRTEC ALLERGY) 10 MG CAPS Take 1 capsule (10 mg total) by mouth daily. 03/23/18   Ofilia Neaslark, Michael L, PA-C  desonide (DESOWEN) 0.05 % cream Apply topically 2 (two) times daily. 03/23/18   Ofilia Neaslark, Michael L, PA-C  hydrochlorothiazide (HYDRODIURIL) 25 MG tablet Take 25 mg by mouth every evening.     [provider]  metoprolol (TOPROL-XL) 50 MG 24 hr tablet Take 50 mg by mouth every evening.     [provider]  naproxen (NAPROSYN) 375 MG tablet Take 1 tablet (375 mg total) by mouth 2 (two) times daily. 02/24/18   Georgetta HaberBurky, Natalie B, NP  nitrofurantoin, macrocrystal-monohydrate, (MACROBID) 100 MG capsule Take 1 capsule (100 mg total) by mouth 2 (two) times daily. 07/06/18   Brock BadHarper, Charles A, MD  nystatin-triamcinolone [facility](MYCOLOG II) cream Apply to affected  area twice daily 01/11/19   Janne NapoleonNeese, Berish Bohman M, NP  omeprazole (PRILOSEC) 20 MG capsule Take 20 mg by mouth 2 (two) times daily.     [provider]  potassium chloride SA (K-DUR,KLOR-CON) 20 MEQ tablet Take 1 tablet (20 mEq total) by mouth daily. 02/13/14   Dione BoozeGlick, David, MD  sertraline (ZOLOFT) 100 MG tablet Take 100 mg by mouth every evening.     [provider]  traMADol (ULTRAM) 50 MG tablet Take 1-2 tablets (50-100 mg total) by mouth every 6 (six) hours as needed. 10/02/18   Kathryne HitchBlackman, Christopher Y, MD    Family History No family history on file.  Social History Social History   Tobacco Use  . Smoking status: Current Some Day Smoker  . Smokeless tobacco: Never Used  Substance Use Topics  . Alcohol use: No  . Drug use: No     Allergies   Patient has no known allergies.   Review of Systems Review of Systems  Genitourinary: Positive for frequency and urgency.  Musculoskeletal: Positive for arthralgias.  All other systems reviewed and are negative.  Physical Exam Updated Vital Signs BP (!) 148/62   Pulse 88   Temp 97.9 F (36.6 C) (Oral)   Resp 20   Ht 5\' 5"  (1.651 m)   Wt 113.4 kg   SpO2 98%   BMI 41.60 kg/m   Physical Exam Vitals signs and nursing note reviewed.  Constitutional:      General: She is not in acute distress.    Appearance: She is well-developed.  HENT:     Head: Normocephalic.  Neck:     Musculoskeletal: Neck supple.  Pulmonary:     Effort: Pulmonary effort is normal.  Abdominal:     Palpations: Abdomen is soft.     Tenderness: There is no abdominal tenderness.  Genitourinary:      Comments: Moist erythematous rash inside the folds of the thighs c/w monilia.   Musculoskeletal:     Right lower leg: She exhibits tenderness and swelling. She exhibits no deformity and no laceration.  Skin:    General: Skin is warm and dry.  Neurological:     Mental Status: She is alert and oriented to person, place, and time.    Psychiatric:        Mood and Affect: Mood normal.      ED Treatments / Results  Labs (all labs ordered are listed, but only abnormal results are displayed) Labs Reviewed  URINALYSIS, ROUTINE W REFLEX MICROSCOPIC - Abnormal; Notable for the following components:      Result Value   Color, Urine STRAW (*)    Specific Gravity, Urine 1.004 (*)    All other components within normal limits   Radiology Vas [place] Lower Extremity Venous (dvt) (only [facility])  Result Date: 01/11/2019  Lower Venous Study Indications: Pain.  Performing Technologist: Levada Schillingharlotte Bynum RDMS, RVT  Examination Guidelines: A complete evaluation includes B-mode imaging, spectral Doppler, color Doppler, and power Doppler as needed of all accessible portions of each vessel. Bilateral testing is considered an integral part of a complete examination. Limited examinations for reoccurring indications may be performed as noted.  Right Venous Findings: +---------+---------------+---------+-----------+----------+------------------+          CompressibilityPhasicitySpontaneityPropertiesSummary            +---------+---------------+---------+-----------+----------+------------------+ CFV      Full           Yes      Yes                                     +---------+---------------+---------+-----------+----------+------------------+ SFJ      Full                                                            +---------+---------------+---------+-----------+----------+------------------+ FV Prox  Full                                                            +---------+---------------+---------+-----------+----------+------------------+ FV Mid   Full                                                            +---------+---------------+---------+-----------+----------+------------------+  FV DistalFull                                                             +---------+---------------+---------+-----------+----------+------------------+ PFV      Full                                                            +---------+---------------+---------+-----------+----------+------------------+ POP      Full           Yes      Yes                                     +---------+---------------+---------+-----------+----------+------------------+ PTV      Full                                         limited                                                                  visualization      +---------+---------------+---------+-----------+----------+------------------+ PERO     Full                                         poor visualization +---------+---------------+---------+-----------+----------+------------------+ GSV      Full                                                            +---------+---------------+---------+-----------+----------+------------------+  Left Venous Findings: +---+---------------+---------+-----------+----------+-------+    CompressibilityPhasicitySpontaneityPropertiesSummary +---+---------------+---------+-----------+----------+-------+ CFVFull           Yes      Yes                          +---+---------------+---------+-----------+----------+-------+    Summary: Right: There is no evidence of deep vein thrombosis in the lower extremity. However, portions of this examination were limited- see technologist comments above. No cystic structure found in the popliteal fossa. Exam limited by patient body habitus. Left: No evidence of common femoral vein obstruction.  *See table(s) above for measurements and observations.    Preliminary     Procedures Procedures (including critical care time)  Medications Ordered in ED Medications - No data to display   Initial Impression / Assessment and Plan / ED Course  I have reviewed the triage vital signs and the nursing notes. 58 y.o. female  here with right thigh pain and rash to the  vaginal area stable for d/c without DVT noted on ultrasound study. Discussed with patient need for follow up with PCP, discussed no UTI but rash in genital area appears as yeast. Will treat with Mycolog II Cream  Final Clinical Impressions(s) / ED Diagnoses   Final diagnoses:  Right thigh pain  Monilial vulvitis    ED Discharge Orders         Ordered    Compression stockings  Status:  Canceled     01/11/19 1831    nystatin-triamcinolone (MYCOLOG II) cream     01/11/19 1848           Damian Leavell [place], NP 01/11/19 2214    Terrilee Files, MD 01/11/19 2322

## 2019-01-11 NOTE — ED Triage Notes (Signed)
Pt sts pain in her inner right thigh. Pt states when she lays down, she cannot move it. Pt endorses swelling in her upper leg.

## 2019-01-11 NOTE — Progress Notes (Signed)
*  PRELIMINARY RESULTS* Vascular Ultrasound Right lower extremity venous duplex has been completed.  Please see CV Proc tab for preliminary results.  Chauncey FischerCharlotte C Ivar Domangue 01/11/2019, 5:58 PM

## 2020-04-23 ENCOUNTER — Other Ambulatory Visit: Payer: Self-pay

## 2020-04-23 ENCOUNTER — Encounter (HOSPITAL_COMMUNITY): Payer: Self-pay | Admitting: *Deleted

## 2020-04-23 ENCOUNTER — Ambulatory Visit (HOSPITAL_COMMUNITY)
Admission: EM | Admit: 2020-04-23 | Discharge: 2020-04-23 | Disposition: A | Discharge status: Home / Self Care | Payer: Medicaid Other | Attending: Physician Assistant | Admitting: Physician Assistant

## 2020-04-23 DIAGNOSIS — M7989 Other specified soft tissue disorders: Secondary | ICD-10-CM | POA: Diagnosis not present

## 2020-04-23 HISTORY — DX: Gastro-esophageal reflux disease without esophagitis: K21.9

## 2020-04-23 MED ORDER — IBUPROFEN 400 MG PO TABS: 400.0000 mg | ORAL_TABLET | Freq: Four times a day (QID) | ORAL | 0 refills | Status: DC | PRN

## 2020-04-23 NOTE — Discharge Instructions (Addendum)
Apply warm compresses and monitor for worsening swelling. Apply nesporyn and keen bandaged   Take ibuprofen every 6 hours for 3-4 days and then as needed  Please return for reevaluation or follow up with your primary care if no improvement

## 2020-04-23 NOTE — ED Triage Notes (Signed)
C/O right middle finger pain without injury x 1 wk.  Also wishes to have Covid test, as close contact is at [facility] with sxs.  Pt denies any Covid sxs.

## 2020-04-23 NOTE — ED Provider Notes (Signed)
[facility]    CSN: [id number] Arrival date & time: 04/23/20  1707      History   Chief Complaint Chief Complaint  Patient presents with  . Hand Pain  . Covid test    HPI Lisa Macdonald is a 59 y.o. female.   Patient reports for evaluation of finger pain and swelling in the right middle finger.  She reports this started 3 weeks ago.  She denies any significant change pain or swelling..  Denies redness or drainage.  She reports is little painful to touch.  She reports full range of motion of the finger.  Denies injuring the finger or cutting the finger.  There is also been no discoloration.  She is also asking about having a Covid test done.  She reports she lives with a person who is being actively tested for Covid.  She denies any symptoms today.  We discussed that as we do not have a confirmed Covid test from that person and she is not displaying any symptoms it would be best to wait.  She reports this is fine as this was not her primary reason for the visit today.  We discussed that she should return if developing symptoms or 4 to 5 days after any confirmed case that she was exposed to.     Past Medical History:  Diagnosis Date  . Arthritis   . GERD (gastroesophageal reflux disease)   . Hypertension     Patient Active Problem List   Diagnosis Date Noted  . Unilateral primary osteoarthritis, right hip 09/11/2017  . Pain in right hip 09/11/2017    History reviewed. No pertinent surgical history.  OB History    Gravida  3   Para      Term      Preterm      AB      Living  3     SAB      TAB      Ectopic      Multiple      Live Births               Home Medications    Prior to Admission medications   Medication Sig Start Date End Date Taking? Authorizing Provider  Cetirizine HCl (ZYRTEC ALLERGY) 10 MG CAPS Take 1 capsule (10 mg total) by mouth daily. 03/23/18  Yes Ofilia Neas, PA-C  hydrochlorothiazide (HYDRODIURIL) 25 MG tablet  Take 25 mg by mouth every evening.    Yes [provider]  metoprolol (TOPROL-XL) 50 MG 24 hr tablet Take 50 mg by mouth every evening.    Yes [provider]  omeprazole (PRILOSEC) 20 MG capsule Take 20 mg by mouth 2 (two) times daily.    Yes [provider]  potassium chloride SA (K-DUR,KLOR-CON) 20 MEQ tablet Take 1 tablet (20 mEq total) by mouth daily. 02/13/14  Yes Dione Booze, MD  desonide (DESOWEN) 0.05 % cream Apply topically 2 (two) times daily. 03/23/18   Ofilia Neas, PA-C  ibuprofen (ADVIL) 400 MG tablet Take 1 tablet (400 mg total) by mouth every 6 (six) hours as needed. 04/23/20   Palma Buster, Veryl Speak, PA-C  naproxen (NAPROSYN) 375 MG tablet Take 1 tablet (375 mg total) by mouth 2 (two) times daily. 02/24/18   Georgetta Haber, NP  nitrofurantoin, macrocrystal-monohydrate, (MACROBID) 100 MG capsule Take 1 capsule (100 mg total) by mouth 2 (two) times daily. 07/06/18   Brock Bad, MD  nystatin-triamcinolone [facility] II)  cream Apply to affected area twice daily 01/11/19   Debroah Baller M, NP  sertraline (ZOLOFT) 100 MG tablet Take 100 mg by mouth every evening.     [provider]  traMADol (ULTRAM) 50 MG tablet Take 1-2 tablets (50-100 mg total) by mouth every 6 (six) hours as needed. 10/02/18   Mcarthur Rossetti, MD    Family History Family History  Problem Relation Age of Onset  . Healthy Mother     Social History Social History   Tobacco Use  . Smoking status: Current Some Day Smoker  . Smokeless tobacco: Never Used  Substance Use Topics  . Alcohol use: Yes    Comment: rarely  . Drug use: No     Allergies   Patient has no known allergies.   Review of Systems Review of Systems   Physical Exam Triage Vital Signs ED Triage Vitals  Enc Vitals Group     BP 04/23/20 1736 137/72     Pulse Rate 04/23/20 1736 65     Resp 04/23/20 1736 (!) 22     Temp 04/23/20 1736 98.2 F (36.8 C)     Temp Source 04/23/20 1736 Oral     SpO2  04/23/20 1736 100 %     Weight --      Height --      Head Circumference --      Peak Flow --      Pain Score 04/23/20 1735 10     Pain Loc --      Pain Edu? --      Excl. in [place]? --    No data found.  Updated Vital Signs BP 137/72   Pulse 65   Temp 98.2 F (36.8 C) (Oral)   Resp (!) 22   SpO2 100%   Visual Acuity Right Eye Distance:   Left Eye Distance:   Bilateral Distance:    Right Eye Near:   Left Eye Near:    Bilateral Near:     Physical Exam Vitals and nursing note reviewed.  Constitutional:      General: She is not in acute distress.    Appearance: She is well-developed.  HENT:     Head: Normocephalic and atraumatic.  Eyes:     Conjunctiva/sclera: Conjunctivae normal.  Cardiovascular:     Rate and Rhythm: Normal rate.  Pulmonary:     Effort: Pulmonary effort is normal. No respiratory distress.  Musculoskeletal:     Cervical back: Neck supple.  Skin:    General: Skin is warm and dry.     Comments: Right third digit with mild swelling proximal to the cuticle.  There is no erythema or visible abscess.  Does feel to be a collection of fluid however.  Mildly tender to touch.  Patient able to completely bend finger.  Nail and cuticle unaffected.  There is also no fluctuance or tenderness along the palmar aspect of this finger.  Neurological:     Mental Status: She is alert.      UC Treatments / Results  Labs (all labs ordered are listed, but only abnormal results are displayed) Labs Reviewed - No data to display  EKG   Radiology No results found.  Procedures Procedures (including critical care time)  Needle aspiration: Patient was treated with topical numbing medications and area cleansed with alcohol wipe.  Initially utilized 18-gauge needle to create a puncture into the area of suspected fluid collection.  No fluid expelled or purulence.  Scant blood.  Decision to utilize 23-gauge with syringe to attempt aspiration was made.  Slightly deeper  exploration needle was conducted, without fluid aspiration.  Continue to have no drainage of serous or purulent discharge.   Medications Ordered in UC Medications - No data to display  Initial Impression / Assessment and Plan / UC Course  I have reviewed the triage vital signs and the nursing notes.  Pertinent labs & imaging results that were available during my care of the patient were reviewed by me and considered in my medical decision making (see chart for details).     #Finger swelling Patient 59 year old presenting concern of finger swelling and pain over the last 3 weeks.  Some consideration for paronychia was given however it was less likely given duration of symptoms without significant worsening, so decision to attempt needle aspiration to check for purulence was made.  No purulence found.  Given the significant lack of erythema and change over 3 weeks doubt infectious.  Likely local inflammation.  We will have patient conduct warm soapy water soaks and monitor and take ibuprofen.  Strict return precautions for significant changes or worsening symptoms. -We discussed given Covid has not been confirmed in the person of concern, we should wait to test.  I instructed the patient that if person she is concerned about having Covid has a positive test that she should return in 5 days or when symptoms arise.  Patient verbalized understanding this.  And agrees to this plan Final Clinical Impressions(s) / UC Diagnoses   Final diagnoses:  Finger swelling     Discharge Instructions     Apply warm compresses and monitor for worsening swelling. Apply nesporyn and keen bandaged   Take ibuprofen every 6 hours for 3-4 days and then as needed  Please return for reevaluation or follow up with your primary care if no improvement      ED Prescriptions    Medication Sig Dispense Auth. Provider   ibuprofen (ADVIL) 400 MG tablet Take 1 tablet (400 mg total) by mouth every 6 (six) hours as  needed. 30 tablet Vinayak Bobier, Veryl Speak, PA-C     PDMP not reviewed this encounter.   Hermelinda Medicus, PA-C 04/23/20 2242

## 2020-06-02 ENCOUNTER — Other Ambulatory Visit: Payer: Self-pay | Admitting: Internal Medicine

## 2020-06-02 DIAGNOSIS — E2839 Other primary ovarian failure: Secondary | ICD-10-CM

## 2021-01-04 ENCOUNTER — Encounter: Payer: Medicaid Other | Admitting: Internal Medicine

## 2021-01-10 ENCOUNTER — Ambulatory Visit (INDEPENDENT_AMBULATORY_CARE_PROVIDER_SITE_OTHER): Payer: Medicaid Other | Admitting: Internal Medicine

## 2021-01-10 VITALS — BP 143/93 | HR 75 | Temp 97.9°F | Wt 261.3 lb

## 2021-01-10 DIAGNOSIS — Z1329 Encounter for screening for other suspected endocrine disorder: Secondary | ICD-10-CM | POA: Diagnosis not present

## 2021-01-10 DIAGNOSIS — Z13220 Encounter for screening for lipoid disorders: Secondary | ICD-10-CM

## 2021-01-10 DIAGNOSIS — Z131 Encounter for screening for diabetes mellitus: Secondary | ICD-10-CM

## 2021-01-10 DIAGNOSIS — D509 Iron deficiency anemia, unspecified: Secondary | ICD-10-CM

## 2021-01-10 DIAGNOSIS — R3589 Other polyuria: Secondary | ICD-10-CM | POA: Diagnosis not present

## 2021-01-10 DIAGNOSIS — I10 Essential (primary) hypertension: Secondary | ICD-10-CM | POA: Diagnosis not present

## 2021-01-10 LAB — POCT GLYCOSYLATED HEMOGLOBIN (HGB A1C): Hemoglobin A1C: 5.7 % — AB (ref 4.0–5.6)

## 2021-01-10 LAB — GLUCOSE, CAPILLARY: Glucose-Capillary: 99 mg/dL (ref 70–99)

## 2021-01-10 NOTE — Progress Notes (Unsigned)
New Patient Office Visit  Subjective:  Patient ID: Lisa Macdonald, female    DOB: [birth year]  Age: 60 y.o. MRN: [id number]  CC:  Chief Complaint  Patient presents with  . Establish Care    HPI Lisa Macdonald presents to establish care and for concern for polyuria, particularly at night. Please see problem based charting for further details regarding today's visit.   Past Medical History:  Diagnosis Date  . Arthritis   . GERD (gastroesophageal reflux disease)   . Hypertension     History reviewed. No pertinent surgical history.  Family History  Problem Relation Age of Onset  . Healthy Mother     Social History   Socioeconomic History  . Marital status: Single    Spouse name: Not on file  . Number of children: Not on file  . Years of education: Not on file  . Highest education level: Not on file  Occupational History  . Not on file  Tobacco Use  . Smoking status: Current Some Day Smoker  . Smokeless tobacco: Never Used  Vaping Use  . Vaping Use: Never used  Substance and Sexual Activity  . Alcohol use: Yes    Comment: rarely  . Drug use: No  . Sexual activity: Not on file  Other Topics Concern  . Not on file  Social History Narrative  . Not on file   Social Determinants of Health   Financial Resource Strain: Not on file  Food Insecurity: Not on file  Transportation Needs: Not on file  Physical Activity: Not on file  Stress: Not on file  Social Connections: Not on file  Intimate Partner Violence: Not on file    ROS Review of Systems  Constitutional: Negative for chills, fatigue, fever and unexpected weight change.  HENT: Negative for hearing loss and trouble swallowing.   Eyes: Negative for visual disturbance.  Respiratory: Negative for cough and shortness of breath.   Cardiovascular: Negative for chest pain, palpitations and leg swelling.  Gastrointestinal: Negative for abdominal pain, constipation, diarrhea and nausea.  Endocrine: Positive  for polydipsia and polyuria.  Genitourinary: Negative for flank pain, hematuria and pelvic pain.  Musculoskeletal: Positive for arthralgias and gait problem.  Skin: Negative for rash.  Neurological: Negative for weakness, light-headedness, numbness and headaches.  Psychiatric/Behavioral: Negative for dysphoric mood and sleep disturbance.    Objective:   Today's Vitals: BP (!) 143/93 (BP Location: Left Arm, Patient Position: Sitting, Cuff Size: Normal)   Pulse 75   Temp 97.9 F (36.6 C) (Oral)   Wt 261 lb 4.8 oz (118.5 kg)   SpO2 100%   BMI 43.48 kg/m   Physical Exam Constitutional:      General: She is not in acute distress.    Appearance: Normal appearance.  HENT:     Nose: Nose normal.     Mouth/Throat:     Pharynx: Oropharynx is clear.  Eyes:     Conjunctiva/sclera: Conjunctivae normal.  Cardiovascular:     Rate and Rhythm: Normal rate and regular rhythm.     Pulses: Normal pulses.  Pulmonary:     Effort: Pulmonary effort is normal.     Breath sounds: Normal breath sounds.  Abdominal:     General: Bowel sounds are normal. There is no distension.     Palpations: Abdomen is soft.     Tenderness: There is no abdominal tenderness.  Musculoskeletal:     Cervical back: Neck supple.     Right lower leg: No edema.  Left lower leg: No edema.  Lymphadenopathy:     Cervical: No cervical adenopathy.  Skin:    General: Skin is warm and dry.  Neurological:     General: No focal deficit present.     Mental Status: She is alert.  Psychiatric:        Mood and Affect: Mood normal.        Behavior: Behavior normal.     Assessment & Plan:   Problem List Items Addressed This Visit      Cardiovascular and Mediastinum   Essential hypertension - Primary    Patient's blood pressure is mildly elevated today. Denies any symptoms of elevated blood pressure. Notes she stresses about coming to doctors. Will continue single therapy of HCTZ 25 mg daily. Consider additional therapy  at next visit if she remains elevated.       Relevant Orders   CMP14 + Anion Gap (Completed)     Other   Iron deficiency anemia    CBC and iron studies checked today which are normal. She is on PO iron supplementation. Per chart review, I cannot find documented etiology for IDA. She thinks she has had colonoscopy done so will need to try to obtain records.       Relevant Medications   FEROSUL 325 (65 Fe) MG tablet   Other Relevant Orders   CBC with Diff (Completed)   Iron (Completed)   Ferritin (Completed)   Polyuria    Patient notes chronic issue of polyuria, particularly at night. She has a friend that is ESRD and is concerned she might be exhibiting initial symptoms.  CMP and U/A are unremarkable. She is in the pre-diabetic range with A1C of 5.7 which I doubt is causing her symptoms.  Will continue to monitor and discuss lifestyle adjustments to combat nocturia at follow-up visit.      Relevant Orders   POC Hbg A1C (Completed)   Urinalysis, Reflex Microscopic (Completed)   Screening cholesterol level    Lipid Panel     Component Value Date/Time   CHOL 185 01/10/2021 1650   TRIG 93 01/10/2021 1650   HDL 51 01/10/2021 1650   CHOLHDL 3.6 01/10/2021 1650   LDLCALC 117 (H) 01/10/2021 1650   LABVLDL 17 01/10/2021 1650   Based on ASCVD risk calculation, she is intermediate risk and would benefit from moderate to high intensity statin therapy.  Discuss initiating statin at follow-up visit.       Relevant Orders   Lipid Profile (Completed)    Other Visit Diagnoses    Diabetes mellitus screening       Relevant Orders   POC Hbg A1C (Completed)   Thyroid disorder screening       Relevant Orders   TSH (Completed)      Outpatient Encounter Medications as of 01/10/2021  Medication Sig  . potassium chloride (KLOR-CON) 10 MEQ tablet Take 1 tablet by mouth daily.  . Cetirizine HCl (ZYRTEC ALLERGY) 10 MG CAPS Take 1 capsule (10 mg total) by mouth daily.  Marland Kitchen desonide (DESOWEN)  0.05 % cream Apply topically 2 (two) times daily.  . FEROSUL 325 (65 Fe) MG tablet Take 325 mg by mouth daily.  . hydrochlorothiazide (HYDRODIURIL) 25 MG tablet Take 25 mg by mouth every evening.   Marland Kitchen ibuprofen (ADVIL) 400 MG tablet Take 1 tablet (400 mg total) by mouth every 6 (six) hours as needed.  . metoprolol (TOPROL-XL) 50 MG 24 hr tablet Take 50 mg by mouth every evening.   Marland Kitchen  naproxen (NAPROSYN) 375 MG tablet Take 1 tablet (375 mg total) by mouth 2 (two) times daily.  . nitrofurantoin, macrocrystal-monohydrate, (MACROBID) 100 MG capsule Take 1 capsule (100 mg total) by mouth 2 (two) times daily.  Marland Kitchen nystatin-triamcinolone (MYCOLOG II) cream Apply to affected area twice daily  . omeprazole (PRILOSEC) 20 MG capsule Take 20 mg by mouth 2 (two) times daily.   . potassium chloride SA (K-DUR,KLOR-CON) 20 MEQ tablet Take 1 tablet (20 mEq total) by mouth daily.  . sertraline (ZOLOFT) 100 MG tablet Take 100 mg by mouth every evening.   . traMADol (ULTRAM) 50 MG tablet Take 1-2 tablets (50-100 mg total) by mouth every 6 (six) hours as needed.  . [DISCONTINUED] hydrochlorothiazide (HYDRODIURIL) 12.5 MG tablet Take 12.5 mg by mouth daily.   No facility-administered encounter medications on file as of 01/10/2021.    Follow-up: Return in about 3 months (around 04/10/2021) for PCP follow-up .   Bridget Hartshorn, DO

## 2021-01-10 NOTE — Patient Instructions (Signed)
Ms. Pluta, It was nice meeting you! We are glad to have you establishing care in our clinic.   Today we discussed:  -frequent urination: I am checking some labs today and will let you know when I have these results.   Health maintenance: we will try to obtain your colonoscopy records so we can know when you are due next.   Take care! Dr. Chesley Mires

## 2021-01-11 LAB — CMP14 + ANION GAP
ALT: 15 IU/L (ref 0–32)
AST: 14 IU/L (ref 0–40)
Albumin/Globulin Ratio: 1.2 (ref 1.2–2.2)
Albumin: 4 g/dL (ref 3.8–4.9)
Alkaline Phosphatase: 146 IU/L — ABNORMAL HIGH (ref 44–121)
Anion Gap: 14 mmol/L (ref 10.0–18.0)
BUN/Creatinine Ratio: 21 (ref 9–23)
BUN: 12 mg/dL (ref 6–24)
Bilirubin Total: 0.3 mg/dL (ref 0.0–1.2)
CO2: 27 mmol/L (ref 20–29)
Calcium: 9.6 mg/dL (ref 8.7–10.2)
Chloride: 97 mmol/L (ref 96–106)
Creatinine, Ser: 0.58 mg/dL (ref 0.57–1.00)
GFR calc Af Amer: 117 mL/min/{1.73_m2} (ref >59)
GFR calc non Af Amer: 101 mL/min/{1.73_m2} (ref >59)
Globulin, Total: 3.4 g/dL (ref 1.5–4.5)
Glucose: 92 mg/dL (ref 65–99)
Potassium: 3.6 mmol/L (ref 3.5–5.2)
Sodium: 138 mmol/L (ref 134–144)
Total Protein: 7.4 g/dL (ref 6.0–8.5)

## 2021-01-11 LAB — URINALYSIS, ROUTINE W REFLEX MICROSCOPIC
Bilirubin, UA: NEGATIVE
Glucose, UA: NEGATIVE
Ketones, UA: NEGATIVE
Nitrite, UA: NEGATIVE
Protein,UA: NEGATIVE
RBC, UA: NEGATIVE
Specific Gravity, UA: 1.023 (ref 1.005–1.030)
Urobilinogen, Ur: 1 mg/dL (ref 0.2–1.0)
pH, UA: 6 (ref 5.0–7.5)

## 2021-01-11 LAB — CBC WITH DIFFERENTIAL/PLATELET
Basophils Absolute: 0.1 10*3/uL (ref 0.0–0.2)
Basos: 1 %
EOS (ABSOLUTE): 0.3 10*3/uL (ref 0.0–0.4)
Eos: 4 %
Hematocrit: 35.4 % (ref 34.0–46.6)
Hemoglobin: 11.7 g/dL (ref 11.1–15.9)
Immature Grans (Abs): 0.1 10*3/uL (ref 0.0–0.1)
Immature Granulocytes: 1 %
Lymphocytes Absolute: 2.2 10*3/uL (ref 0.7–3.1)
Lymphs: 30 %
MCH: 25.4 pg — ABNORMAL LOW (ref 26.6–33.0)
MCHC: 33.1 g/dL (ref 31.5–35.7)
MCV: 77 fL — ABNORMAL LOW (ref 79–97)
Monocytes Absolute: 0.4 10*3/uL (ref 0.1–0.9)
Monocytes: 6 %
Neutrophils Absolute: 4.2 10*3/uL (ref 1.4–7.0)
Neutrophils: 58 %
Platelets: 364 10*3/uL (ref 150–450)
RBC: 4.6 x10E6/uL (ref 3.77–5.28)
RDW: 12.8 % (ref 11.7–15.4)
WBC: 7.2 10*3/uL (ref 3.4–10.8)

## 2021-01-11 LAB — MICROSCOPIC EXAMINATION
Bacteria, UA: NONE SEEN
Casts: NONE SEEN /lpf

## 2021-01-11 LAB — FERRITIN: Ferritin: 104 ng/mL (ref 15–150)

## 2021-01-11 LAB — LIPID PANEL
Chol/HDL Ratio: 3.6 ratio (ref 0.0–4.4)
Cholesterol, Total: 185 mg/dL (ref 100–199)
HDL: 51 mg/dL (ref >39)
LDL Chol Calc (NIH): 117 mg/dL — ABNORMAL HIGH (ref 0–99)
Triglycerides: 93 mg/dL (ref 0–149)
VLDL Cholesterol Cal: 17 mg/dL (ref 5–40)

## 2021-01-11 LAB — IRON: Iron: 47 ug/dL (ref 27–159)

## 2021-01-11 LAB — TSH: TSH: 2.03 u[IU]/mL (ref 0.450–4.500)

## 2021-01-12 ENCOUNTER — Encounter: Payer: Self-pay | Admitting: Internal Medicine

## 2021-01-12 DIAGNOSIS — Z13220 Encounter for screening for lipoid disorders: Secondary | ICD-10-CM | POA: Insufficient documentation

## 2021-01-12 NOTE — Assessment & Plan Note (Signed)
Patient notes chronic issue of polyuria, particularly at night. She has a friend that is ESRD and is concerned she might be exhibiting initial symptoms.  CMP and U/A are unremarkable. She is in the pre-diabetic range with A1C of 5.7 which I doubt is causing her symptoms.  Will continue to monitor and discuss lifestyle adjustments to combat nocturia at follow-up visit.

## 2021-01-12 NOTE — Assessment & Plan Note (Signed)
CBC and iron studies checked today which are normal. She is on PO iron supplementation. Per chart review, I cannot find documented etiology for IDA. She thinks she has had colonoscopy done so will need to try to obtain records.

## 2021-01-12 NOTE — Assessment & Plan Note (Signed)
Lipid Panel     Component Value Date/Time   CHOL 185 01/10/2021 1650   TRIG 93 01/10/2021 1650   HDL 51 01/10/2021 1650   CHOLHDL 3.6 01/10/2021 1650   LDLCALC 117 (H) 01/10/2021 1650   LABVLDL 17 01/10/2021 1650   Based on ASCVD risk calculation, she is intermediate risk and would benefit from moderate to high intensity statin therapy.  Discuss initiating statin at follow-up visit.

## 2021-01-12 NOTE — Assessment & Plan Note (Signed)
Patient's blood pressure is mildly elevated today. Denies any symptoms of elevated blood pressure. Notes she stresses about coming to doctors. Will continue single therapy of HCTZ 25 mg daily. Consider additional therapy at next visit if she remains elevated.

## 2021-01-13 NOTE — Progress Notes (Signed)
Internal Medicine Clinic Attending  Case discussed with Dr. Bloomfield  At the time of the visit.  We reviewed the resident's history and exam and pertinent patient test results.  I agree with the assessment, diagnosis, and plan of care documented in the resident's note.  

## 2021-12-20 ENCOUNTER — Ambulatory Visit: Payer: Medicaid Other | Admitting: Podiatry

## 2021-12-25 ENCOUNTER — Other Ambulatory Visit: Payer: Self-pay

## 2021-12-25 ENCOUNTER — Ambulatory Visit (INDEPENDENT_AMBULATORY_CARE_PROVIDER_SITE_OTHER): Payer: Medicaid Other | Admitting: Podiatry

## 2021-12-25 ENCOUNTER — Encounter: Payer: Self-pay | Admitting: Podiatry

## 2021-12-25 DIAGNOSIS — B351 Tinea unguium: Secondary | ICD-10-CM

## 2021-12-25 DIAGNOSIS — M79675 Pain in left toe(s): Secondary | ICD-10-CM

## 2021-12-25 DIAGNOSIS — M79674 Pain in right toe(s): Secondary | ICD-10-CM

## 2021-12-25 DIAGNOSIS — L84 Corns and callosities: Secondary | ICD-10-CM

## 2021-12-25 NOTE — Progress Notes (Signed)
°  Subjective:  Patient ID: Lisa Macdonald, female    DOB: 25-Aug-1961,   MRN: [id number]  Chief Complaint  Patient presents with   Debridement    Requesting nail and callus trimming bilateral - unable to trim herself due to hip trouble   New Patient (Initial Visit)    61 y.o. female presents for concern of thickened elongated and painful toenails as well as painful callus. She is pre-diabetic. Not on a blood thinner. Relates some swelling in her ankles and feet but other than that no other problems.  . Denies any other pedal complaints. Denies n/v/f/c.   Past Medical History:  Diagnosis Date   Arthritis    GERD (gastroesophageal reflux disease)    Hypertension     Objective:  Physical Exam: Vascular: DP/PT pulses 2/4 bilateral. CFT <3 seconds. Normal hair growth on digits. Bilateral lower extremity edema.  Skin. No lacerations or abrasions bilateral feet. Nails 1-5 are thickened discolored and elongated with subungual debris.  Hyperkeratotic tissue noted sub 5th metatarsal bilateral.  Musculoskeletal: MMT 5/5 bilateral lower extremities in DF, PF, Inversion and Eversion. Deceased ROM in DF of ankle joint.  Neurological: Sensation intact to light touch.   Assessment:   1. Callus of foot   2. Pain due to onychomycosis of toenails of both feet      Plan:  Patient was evaluated and treated and all questions answered. -Discussed corns onychomycosis and calluses with patient and treatment options.  -Hyperkeratotic tissue was debrided with chisel without incident as courtesy.   -Nails 1-5 debrided without incident as courtesy.  -Encouraged daily moisturizing -Discussed use of pumice stone -Compression stockings for swelling.  -Advised good supportive shoes and inserts -Patient to return to office as needed or sooner if condition worsens.   Louann Sjogren, DPM

## 2022-05-14 ENCOUNTER — Other Ambulatory Visit: Payer: Self-pay

## 2022-05-14 ENCOUNTER — Inpatient Hospital Stay (HOSPITAL_COMMUNITY)
Admission: EM | Admit: 2022-05-14 | Discharge: 2022-05-18 | DRG: 552 | Disposition: A | Discharge status: Home Health | Payer: Medicaid Other | Attending: Internal Medicine | Admitting: Internal Medicine

## 2022-05-14 ENCOUNTER — Encounter (HOSPITAL_COMMUNITY): Payer: Self-pay | Admitting: Radiology

## 2022-05-14 DIAGNOSIS — K219 Gastro-esophageal reflux disease without esophagitis: Secondary | ICD-10-CM | POA: Diagnosis present

## 2022-05-14 DIAGNOSIS — E876 Hypokalemia: Secondary | ICD-10-CM | POA: Diagnosis present

## 2022-05-14 DIAGNOSIS — Z87891 Personal history of nicotine dependence: Secondary | ICD-10-CM

## 2022-05-14 DIAGNOSIS — Z833 Family history of diabetes mellitus: Secondary | ICD-10-CM

## 2022-05-14 DIAGNOSIS — Z8249 Family history of ischemic heart disease and other diseases of the circulatory system: Secondary | ICD-10-CM

## 2022-05-14 DIAGNOSIS — T502X5A Adverse effect of carbonic-anhydrase inhibitors, benzothiadiazides and other diuretics, initial encounter: Secondary | ICD-10-CM | POA: Diagnosis present

## 2022-05-14 DIAGNOSIS — R29898 Other symptoms and signs involving the musculoskeletal system: Principal | ICD-10-CM

## 2022-05-14 DIAGNOSIS — M545 Low back pain, unspecified: Secondary | ICD-10-CM

## 2022-05-14 DIAGNOSIS — M48061 Spinal stenosis, lumbar region without neurogenic claudication: Principal | ICD-10-CM | POA: Diagnosis present

## 2022-05-14 DIAGNOSIS — M25551 Pain in right hip: Secondary | ICD-10-CM | POA: Diagnosis present

## 2022-05-14 DIAGNOSIS — Z79899 Other long term (current) drug therapy: Secondary | ICD-10-CM

## 2022-05-14 DIAGNOSIS — M1611 Unilateral primary osteoarthritis, right hip: Secondary | ICD-10-CM | POA: Diagnosis present

## 2022-05-14 DIAGNOSIS — R7303 Prediabetes: Secondary | ICD-10-CM | POA: Diagnosis present

## 2022-05-14 DIAGNOSIS — G8929 Other chronic pain: Secondary | ICD-10-CM | POA: Diagnosis present

## 2022-05-14 DIAGNOSIS — M4805 Spinal stenosis, thoracolumbar region: Secondary | ICD-10-CM | POA: Diagnosis present

## 2022-05-14 DIAGNOSIS — I10 Essential (primary) hypertension: Secondary | ICD-10-CM | POA: Diagnosis present

## 2022-05-14 LAB — CBC WITH DIFFERENTIAL/PLATELET
Abs Immature Granulocytes: 0.04 10*3/uL (ref 0.00–0.07)
Basophils Absolute: 0 10*3/uL (ref 0.0–0.1)
Basophils Relative: 1 %
Eosinophils Absolute: 0.1 10*3/uL (ref 0.0–0.5)
Eosinophils Relative: 1 %
HCT: 34.5 % — ABNORMAL LOW (ref 36.0–46.0)
Hemoglobin: 11.2 g/dL — ABNORMAL LOW (ref 12.0–15.0)
Immature Granulocytes: 1 %
Lymphocytes Relative: 27 %
Lymphs Abs: 2 10*3/uL (ref 0.7–4.0)
MCH: 26.6 pg (ref 26.0–34.0)
MCHC: 32.5 g/dL (ref 30.0–36.0)
MCV: 81.9 fL (ref 80.0–100.0)
Monocytes Absolute: 0.5 10*3/uL (ref 0.1–1.0)
Monocytes Relative: 6 %
Neutro Abs: 4.9 10*3/uL (ref 1.7–7.7)
Neutrophils Relative %: 64 %
Platelets: 303 10*3/uL (ref 150–400)
RBC: 4.21 MIL/uL (ref 3.87–5.11)
RDW: 13.8 % (ref 11.5–15.5)
WBC: 7.5 10*3/uL (ref 4.0–10.5)
nRBC: 0 % (ref 0.0–0.2)

## 2022-05-14 LAB — BASIC METABOLIC PANEL
Anion gap: 8 (ref 5–15)
BUN: 9 mg/dL (ref 8–23)
CO2: 28 mmol/L (ref 22–32)
Calcium: 9.2 mg/dL (ref 8.9–10.3)
Chloride: 103 mmol/L (ref 98–111)
Creatinine, Ser: 0.66 mg/dL (ref 0.44–1.00)
GFR, Estimated: >60 mL/min (ref >60)
Glucose, Bld: 95 mg/dL (ref 70–99)
Potassium: 2.4 mmol/L — CL (ref 3.5–5.1)
Sodium: 139 mmol/L (ref 135–145)

## 2022-05-14 LAB — SEDIMENTATION RATE: Sed Rate: 35 mm/hr — ABNORMAL HIGH (ref 0–22)

## 2022-05-14 LAB — C-REACTIVE PROTEIN: CRP: 0.6 mg/dL (ref <1.0)

## 2022-05-14 MED ORDER — FENTANYL CITRATE PF 50 MCG/ML IJ SOSY
50.0000 ug | PREFILLED_SYRINGE | Freq: Once | INTRAMUSCULAR | Status: AC
  Administered 2022-05-14: 50 ug via INTRAVENOUS
  Filled 2022-05-14: qty 1

## 2022-05-14 NOTE — ED Notes (Signed)
Charge nurse Cali at [facility] contacted with report for patient coming to the ED. [facility] contacted to request transport over.

## 2022-05-14 NOTE — ED Triage Notes (Signed)
Pt with history of degenerative joint disease with right hip being per patient bon on bone. For the past three days she has had severe pain that radiated into her back now. Pt goes every three months for hip injections last receiving them in April. Pt called 911 today because she couldn't stand up from the toilet.

## 2022-05-14 NOTE — ED Provider Notes (Signed)
[facility]-EMERGENCY DEPT Provider Note   CSN: [id number] Arrival date & time: 05/14/22  1756     History  Chief Complaint  Patient presents with   Hip Pain    Lisa Macdonald is a 61 y.o. female with chief complaint of right hip and lower back pain today.  Began 3 days ago without specific trauma or incident.  Noticed increasing weakness in her lower extremities, specifically the right at this time as well.  Found herself unable to stand up from the commode and had to call 911.  States she is unable to make it to the restroom when she feels she has to urinate.  Denies saddle anesthesia, recent illnesses, abdominal pain, chest pain, shortness of breath.  Unsure whether she has had fevers or chills.  Hx of recurrent hip injections into her right hip due to primary osteoarthritis, most recent was 4 weeks ago.  No Hx of back surgery.  The history is provided by the patient and medical records.  Hip Pain      Home Medications Prior to Admission medications   Medication Sig Start Date End Date Taking? Authorizing Provider  cetirizine (ZYRTEC) 10 MG tablet SMARTSIG:1 Tablet(s) By Mouth Every Evening PRN 09/15/21   [provider]  FEROSUL 325 (65 Fe) MG tablet Take 325 mg by mouth daily. 12/12/20   [provider]  folic acid (FOLVITE) 1 MG tablet Take 1 mg by mouth daily. 12/18/21   [provider]  hydrochlorothiazide (HYDRODIURIL) 25 MG tablet Take 25 mg by mouth every evening.     [provider]  ibuprofen (ADVIL) 800 MG tablet Take 800 mg by mouth 3 (three) times daily as needed. 12/06/21   [provider]  meloxicam (MOBIC) 15 MG tablet Take 15 mg by mouth daily. 08/30/21   [provider]  metoprolol (TOPROL-XL) 50 MG 24 hr tablet Take 50 mg by mouth every evening.     [provider]  omeprazole (PRILOSEC) 20 MG capsule Take 20 mg by mouth 2 (two) times daily.     [provider]  potassium  chloride (KLOR-CON) 10 MEQ tablet Take 1 tablet by mouth daily. 03/30/20   [provider]  sertraline (ZOLOFT) 100 MG tablet Take 100 mg by mouth every evening.     [provider]  Vitamin D, Ergocalciferol, (DRISDOL) 1.25 MG (50000 UNIT) CAPS capsule Take 50,000 Units by mouth once a week. 11/29/21   [provider]      Allergies    Patient has no known allergies.    Review of Systems   Review of Systems  Musculoskeletal:  Positive for back pain.       Right hip pain  Neurological:  Positive for weakness.   Physical Exam Updated Vital Signs BP (!) 144/81   Pulse 65   Temp 97.9 F (36.6 C) (Oral)   Resp 17   Ht 5' 5" (1.651 m)   Wt 118.4 kg   SpO2 100%   BMI 43.43 kg/m  Physical Exam Vitals and nursing note reviewed.  Constitutional:      General: She is not in acute distress.    Appearance: She is well-developed. She is morbidly obese. She is not ill-appearing or diaphoretic.       Comments: Tenderness to palpation as depicted above.  No erythema, obvious edema, obvious deformity, or significant warmth of the areas.  No overlying skin condition such as rash, ecchymosis, abrasion, laceration, or bruising appreciated.    HENT:     Head: Normocephalic and atraumatic.  Eyes:     Conjunctiva/sclera: Conjunctivae normal.  Cardiovascular:     Rate and Rhythm: Normal rate and regular rhythm.     Pulses: Normal pulses.          Dorsalis pedis pulses are 2+ on the right side and 2+ on the left side.       Posterior tibial pulses are 2+ on the right side and 2+ on the left side.     Heart sounds: No murmur heard.    Comments: Without lower extremity tenderness, swelling, erythema, warmth bilaterally. Pulmonary:     Effort: Pulmonary effort is normal. No respiratory distress.     Breath sounds: Normal breath sounds and air entry.  Abdominal:     Palpations: Abdomen is soft.     Tenderness: There is no abdominal tenderness.  Musculoskeletal:         General: No swelling.     Cervical back: Neck supple. No rigidity.     Right lower leg: No edema.     Left lower leg: No edema.     Comments: Mildly reduced range of motion of the right hip joint in the lumbar spine due to elicited tenderness.  Skin:    General: Skin is warm and dry.     Capillary Refill: Capillary refill takes less than 2 seconds.  Neurological:     Mental Status: She is alert and oriented to person, place, and time. Mental status is at baseline.     GCS: GCS eye subscore is 4. GCS verbal subscore is 5. GCS motor subscore is 6.     Cranial Nerves: No dysarthria or facial asymmetry.     Sensory: No sensory deficit.     Motor: Weakness present. No tremor or seizure activity.     Comments: Weakness of both lower extremities, right worse than the left.  Unable to assess gait due to lower extremity weakness.  Psychiatric:        Mood and Affect: Mood normal.    ED Results / Procedures / Treatments   Labs (all labs ordered are listed, but only abnormal results are displayed) Labs Reviewed  CBC WITH DIFFERENTIAL/PLATELET - Abnormal; Notable for the following components:      Result Value   Hemoglobin 11.2 (*)    HCT 34.5 (*)    All other components within normal limits  BASIC METABOLIC PANEL - Abnormal; Notable for the following components:   Potassium 2.4 (*)    All other components within normal limits  SEDIMENTATION RATE - Abnormal; Notable for the following components:   Sed Rate 35 (*)    All other components within normal limits  C-REACTIVE PROTEIN    EKG None  Radiology No results found.  Procedures Procedures    Medications Ordered in ED Medications - No data to display  ED Course/ Medical Decision Making/ A&P                           Medical Decision Making Amount and/or Complexity of Data Reviewed Labs: ordered. Decision-making details documented in ED Course. Radiology: ordered. Decision-making details documented in ED  Course. ECG/medicine tests: ordered and independent interpretation performed. Decision-making details documented in ED Course.  Risk OTC drugs. Prescription drug management.   61 y.o. female presents to the ED for concern of Hip Pain   This involves an extensive number of treatment options, and is a complaint   that carries with it a high risk of complications and morbidity.    Past Medical History / Co-morbidities / Social History: Hx of DJD/primary OA of the right hip, essential HTN, IDA, GERD, occasional tobacco use, alcohol use. Social Determinants of Health include occasional tobacco use, for cessation counseling was provided.  Additional History:  Internal and external records from outside source obtained and reviewed including orthopedic surgery, internal medicine  Physical Exam: Physical exam performed. The pertinent findings include: Significant tenderness of the right hip and lower lumbar region.  Weakness of bilateral lower extremities, the right worse than the left.  Lower extremities appear to have adequate blood flow and sensation preserved.  No other neurodeficits appreciated on exam.  Lab Tests: I ordered, and personally interpreted labs.  The pertinent results include:   CBC: Mild anemia, close to baseline CMP/BMP: Critically low potassium of 2.4, otherwise unremarkable ESR: Pending CRP: Pending  Imaging Studies: I ordered imaging studies including MRI of the lumbar spine and MRI of the right hip.  I independently visualized and interpreted said imaging.  Pertinent results include: MRI of the lumbar spine: Pending MRI of the right hip: Pending I agree with the radiologist interpretation.  Medications: None at this time.  ED Course/Disposition: Pt well-appearing on exam.  With complaints of right hip pain and low back pain over the last 3 days.  Hx of right hip primary osteoarthritis.  Receives multiple therapeutic injections into said hip for relief.  Most recent  injection was about 4 weeks ago.  Afebrile today, possible intermittent fever/chills.  Noted weakness of the lower extremities, more significantly of the right side.  Pain with right hip passive range of motion.  Midline tenderness of the lower lumbar region.  Unable to bear weight on legs or walk at this time due to weakness.  Blood flow appears intact and sensation preserved to the lower extremities.  Unable to effectively appreciate varied swelling, erythema, or edema due to excess adipose tissue surrounding the right hip and lower back.  Suspicious for septic arthritis of the right hip vs lumbar spine.  MRI ordered to assess.  This facility does not have MRI capabilities at this time.  Coordinating transfer to [place] for further evaluation and treatment.  Accepting physician is Dr. Steinl.  This patient was a shared case encounter with my attending, Dr. Lawsing, who directly contributed to the proposed treatment course and cosigned this note including patient's presenting symptoms, physical exam, and planned diagnostics and interventions.  Attending physician stated agreement with plan or made changes to plan which were implemented.      This chart was dictated using voice recognition software.  Despite best efforts to proofread, errors can occur which can change the documentation meaning.         Final Clinical Impression(s) / ED Diagnoses Final diagnoses:  Right leg weakness  Right hip pain  Unilateral primary osteoarthritis, right hip  Essential hypertension  Acute midline low back pain, unspecified whether sciatica present    Rx / DC Orders ED Discharge Orders     None         Cockerham, Alicia M, PA-C 05/14/22 2303    Lawsing, James, MD 05/14/22 2345  

## 2022-05-14 NOTE — ED Notes (Signed)
Critical K+ 2.4. Provider made aware.

## 2022-05-15 ENCOUNTER — Other Ambulatory Visit: Payer: Self-pay

## 2022-05-15 ENCOUNTER — Encounter (HOSPITAL_COMMUNITY): Payer: Self-pay | Admitting: Internal Medicine

## 2022-05-15 ENCOUNTER — Emergency Department (HOSPITAL_COMMUNITY): Payer: Medicaid Other

## 2022-05-15 DIAGNOSIS — M25551 Pain in right hip: Secondary | ICD-10-CM | POA: Diagnosis present

## 2022-05-15 DIAGNOSIS — Z79899 Other long term (current) drug therapy: Secondary | ICD-10-CM | POA: Diagnosis not present

## 2022-05-15 DIAGNOSIS — G8929 Other chronic pain: Secondary | ICD-10-CM | POA: Diagnosis not present

## 2022-05-15 DIAGNOSIS — R7303 Prediabetes: Secondary | ICD-10-CM | POA: Diagnosis not present

## 2022-05-15 DIAGNOSIS — Z8249 Family history of ischemic heart disease and other diseases of the circulatory system: Secondary | ICD-10-CM | POA: Diagnosis not present

## 2022-05-15 DIAGNOSIS — M48061 Spinal stenosis, lumbar region without neurogenic claudication: Secondary | ICD-10-CM | POA: Diagnosis not present

## 2022-05-15 DIAGNOSIS — Z833 Family history of diabetes mellitus: Secondary | ICD-10-CM | POA: Diagnosis not present

## 2022-05-15 DIAGNOSIS — K219 Gastro-esophageal reflux disease without esophagitis: Secondary | ICD-10-CM | POA: Diagnosis not present

## 2022-05-15 DIAGNOSIS — Z87891 Personal history of nicotine dependence: Secondary | ICD-10-CM | POA: Diagnosis not present

## 2022-05-15 DIAGNOSIS — I10 Essential (primary) hypertension: Secondary | ICD-10-CM | POA: Diagnosis not present

## 2022-05-15 DIAGNOSIS — E876 Hypokalemia: Secondary | ICD-10-CM | POA: Diagnosis not present

## 2022-05-15 DIAGNOSIS — T502X5A Adverse effect of carbonic-anhydrase inhibitors, benzothiadiazides and other diuretics, initial encounter: Secondary | ICD-10-CM | POA: Diagnosis not present

## 2022-05-15 DIAGNOSIS — M1611 Unilateral primary osteoarthritis, right hip: Secondary | ICD-10-CM | POA: Diagnosis not present

## 2022-05-15 DIAGNOSIS — M4805 Spinal stenosis, thoracolumbar region: Secondary | ICD-10-CM | POA: Diagnosis not present

## 2022-05-15 LAB — HEPATIC FUNCTION PANEL
ALT: 16 U/L (ref 0–44)
AST: 15 U/L (ref 15–41)
Albumin: 3.2 g/dL — ABNORMAL LOW (ref 3.5–5.0)
Alkaline Phosphatase: 82 U/L (ref 38–126)
Bilirubin, Direct: 0.1 mg/dL (ref 0.0–0.2)
Indirect Bilirubin: 0.4 mg/dL (ref 0.3–0.9)
Total Bilirubin: 0.5 mg/dL (ref 0.3–1.2)
Total Protein: 6.7 g/dL (ref 6.5–8.1)

## 2022-05-15 LAB — LIPID PANEL
Cholesterol: 172 mg/dL (ref 0–200)
HDL: 49 mg/dL (ref >40)
LDL Cholesterol: 113 mg/dL — ABNORMAL HIGH (ref 0–99)
Total CHOL/HDL Ratio: 3.5 RATIO
Triglycerides: 49 mg/dL (ref <150)
VLDL: 10 mg/dL (ref 0–40)

## 2022-05-15 LAB — BASIC METABOLIC PANEL
Anion gap: 4 — ABNORMAL LOW (ref 5–15)
Anion gap: 8 (ref 5–15)
BUN: 8 mg/dL (ref 8–23)
BUN: 10 mg/dL (ref 8–23)
CO2: 22 mmol/L (ref 22–32)
CO2: 27 mmol/L (ref 22–32)
Calcium: 8 mg/dL — ABNORMAL LOW (ref 8.9–10.3)
Calcium: 9.6 mg/dL (ref 8.9–10.3)
Chloride: 111 mmol/L (ref 98–111)
Chloride: 105 mmol/L (ref 98–111)
Creatinine, Ser: 0.49 mg/dL (ref 0.44–1.00)
Creatinine, Ser: 0.62 mg/dL (ref 0.44–1.00)
GFR, Estimated: >60 mL/min (ref >60)
GFR, Estimated: >60 mL/min (ref >60)
Glucose, Bld: 78 mg/dL (ref 70–99)
Glucose, Bld: 90 mg/dL (ref 70–99)
Potassium: 3 mmol/L — ABNORMAL LOW (ref 3.5–5.1)
Potassium: 3.4 mmol/L — ABNORMAL LOW (ref 3.5–5.1)
Sodium: 137 mmol/L (ref 135–145)
Sodium: 140 mmol/L (ref 135–145)

## 2022-05-15 LAB — VITAMIN B12: Vitamin B-12: 299 pg/mL (ref 180–914)

## 2022-05-15 LAB — PHOSPHORUS: Phosphorus: 3.3 mg/dL (ref 2.5–4.6)

## 2022-05-15 LAB — HIV ANTIBODY (ROUTINE TESTING W REFLEX): HIV Screen 4th Generation wRfx: NONREACTIVE

## 2022-05-15 LAB — MAGNESIUM: Magnesium: 1.8 mg/dL (ref 1.7–2.4)

## 2022-05-15 LAB — TSH: TSH: 2.202 u[IU]/mL (ref 0.350–4.500)

## 2022-05-15 LAB — VITAMIN D 25 HYDROXY (VIT D DEFICIENCY, FRACTURES): Vit D, 25-Hydroxy: 43.85 ng/mL (ref 30–100)

## 2022-05-15 MED ORDER — ONDANSETRON HCL 4 MG/2ML IJ SOLN: 4.0000 mg | Freq: Four times a day (QID) | INTRAMUSCULAR | Status: DC | PRN

## 2022-05-15 MED ORDER — DEXAMETHASONE SODIUM PHOSPHATE 10 MG/ML IJ SOLN
4.0000 mg | Freq: Four times a day (QID) | INTRAMUSCULAR | Status: DC
  Administered 2022-05-16 (×3): 4 mg via INTRAVENOUS
  Filled 2022-05-15 (×3): qty 1

## 2022-05-15 MED ORDER — TRAMADOL HCL 50 MG PO TABS: 50.0000 mg | ORAL_TABLET | Freq: Four times a day (QID) | ORAL | Status: DC | PRN

## 2022-05-15 MED ORDER — ONDANSETRON HCL 4 MG PO TABS: 4.0000 mg | ORAL_TABLET | Freq: Four times a day (QID) | ORAL | Status: DC | PRN

## 2022-05-15 MED ORDER — ACETAMINOPHEN 650 MG RE SUPP: 650.0000 mg | Freq: Four times a day (QID) | RECTAL | Status: DC | PRN

## 2022-05-15 MED ORDER — DEXAMETHASONE SODIUM PHOSPHATE 10 MG/ML IJ SOLN
10.0000 mg | Freq: Once | INTRAMUSCULAR | Status: AC
Start: 2022-05-15 — End: 2022-05-15
  Administered 2022-05-15: 10 mg via INTRAVENOUS
  Filled 2022-05-15: qty 1

## 2022-05-15 MED ORDER — METOPROLOL SUCCINATE ER 25 MG PO TB24
25.0000 mg | ORAL_TABLET | Freq: Every day | ORAL | Status: DC
  Administered 2022-05-15 – 2022-05-18 (×4): 25 mg via ORAL
  Filled 2022-05-15 (×4): qty 1

## 2022-05-15 MED ORDER — SENNOSIDES-DOCUSATE SODIUM 8.6-50 MG PO TABS
1.0000 | ORAL_TABLET | Freq: Every evening | ORAL | Status: DC | PRN
  Filled 2022-05-15: qty 1

## 2022-05-15 MED ORDER — PANTOPRAZOLE SODIUM 40 MG PO TBEC
40.0000 mg | DELAYED_RELEASE_TABLET | Freq: Every day | ORAL | Status: DC
  Administered 2022-05-15 – 2022-05-18 (×4): 40 mg via ORAL
  Filled 2022-05-15 (×4): qty 1

## 2022-05-15 MED ORDER — ACETAMINOPHEN 325 MG PO TABS: 650.0000 mg | ORAL_TABLET | Freq: Four times a day (QID) | ORAL | Status: DC | PRN

## 2022-05-15 MED ORDER — POTASSIUM CHLORIDE CRYS ER 20 MEQ PO TBCR
40.0000 meq | EXTENDED_RELEASE_TABLET | Freq: Two times a day (BID) | ORAL | Status: DC
  Administered 2022-05-15 (×2): 40 meq via ORAL
  Filled 2022-05-15 (×2): qty 2

## 2022-05-15 MED ORDER — HYDROCHLOROTHIAZIDE 12.5 MG PO TABS: 12.5000 mg | ORAL_TABLET | Freq: Every day | ORAL | Status: DC

## 2022-05-15 MED ORDER — POTASSIUM CHLORIDE 10 MEQ/100ML IV SOLN
10.0000 meq | INTRAVENOUS | Status: AC
  Administered 2022-05-15 (×2): 10 meq via INTRAVENOUS
  Filled 2022-05-15 (×2): qty 100

## 2022-05-15 MED ORDER — GADOBUTROL 1 MMOL/ML IV SOLN
10.0000 mL | Freq: Once | INTRAVENOUS | Status: AC | PRN
  Administered 2022-05-15: 10 mL via INTRAVENOUS

## 2022-05-15 MED ORDER — ENOXAPARIN SODIUM 40 MG/0.4ML IJ SOSY
40.0000 mg | PREFILLED_SYRINGE | INTRAMUSCULAR | Status: DC
  Administered 2022-05-15 – 2022-05-17 (×3): 40 mg via SUBCUTANEOUS
  Filled 2022-05-15 (×3): qty 0.4

## 2022-05-15 MED ORDER — POTASSIUM CHLORIDE CRYS ER 20 MEQ PO TBCR
40.0000 meq | EXTENDED_RELEASE_TABLET | Freq: Every day | ORAL | Status: DC
  Administered 2022-05-16: 40 meq via ORAL
  Filled 2022-05-15: qty 2

## 2022-05-15 NOTE — H&P (Signed)
Date: 05/15/2022               Patient Name:  Lisa Macdonald MRN: [id number]  DOB: 1961/06/20 Age / Sex: 61 y.o., female   PCP: Nolene Ebbs, MD              Medical Service: Internal Medicine Teaching Service              Attending Physician: Dr. Lottie Mussel, MD    First Contact: Clydell Hakim, MS 3 Pager: [phone]  Second Contact: Dr. Idamae Schuller  Pager: [phone]  Third Contact Dr. Linwood Dibbles Pager: [phone]       After Hours (After 5p/  First Contact Pager: [phone]  weekends / holidays): Second Contact Pager: [phone]   Chief Complaint: Low back pain, right hip pain  History of Present Illness:  Lisa Macdonald is a 61 y.o. female with PMH significant for severe osteoarthritis of the right hip, hypetension and pre-diabetes who presents to the the hospital for severe right hip pain and low back pain. Pain worsened significantly over the past x3 days to the point where she was unable to lift herself up from the bathroom, prompting her to present for further evaluation. She has been feeling unsteady on her feet when she ambulates and is currently unable to bear weight due to the pain. Her right hip pain radiates to the low back which is new for her and she also endorses new right foot numbness with loss of sensation of her right toes over the past few days associated with her worsening hip pain. She had one episode of urinary incontinence with no bowel incontinence. No recent trauma, fall, or other obvious precipitating factor that exacerbated her pain. Pain is usually a 9/10 however was a 10/10 yesterday. She has chronic history of right hip pain due to her arthritis and has been receiving steroid injections with the most recent injection being 4/12. She receives injections every 3 months and usually help with her pain however her most recent injections only provided relief for 2-3 weeks before her pain returned.    She requires a walker and a cane to ambulate. No recent falls  and no falls associated with the acute worsening of her right hip pain. She denies other symptoms with no chest pain, shortness of breath, or cold-like symptoms.   ED Course:  Potassium 2.4, improved to 3.0 after receiving potassium supplement 80 mEq in the ED. Magnesium 1.8. EKG shows sinus rhythm with prolonged PR.   Review of Systems: A complete ROS was negative except as per HPI.   Past medical history:  Severe osteoarthritis of the right hip Pre-diabetes  Hypertension  Meds:  Metoprolol 25 mg every evening HCTZ 12.5 mg every evening Ferosul 325 mg daily Omeprazole 20 mg daily Potassium chloride 10 mEq daily Cetirizine 10 mg daily PRN Goody powder PRN  Vitamin D 50,000 units every Monday  Allergies: Allergies as of 05/14/2022   (No Known Allergies)   Family History:  History of CABGx3 in father with MI in his 46-60s History of T2DM in father History of breast cancer and osteosarcoma in aunt and cousins  Social History:  Lives in [place] with husband and two daughters Retired Development worker, community Independent in ADLs and iADLs  History of smoking, 3 cigarettes since high school and quit when she was 44.  Does not drink EtOH No other drug use  Physical Exam: Blood pressure 133/85, pulse 73, temperature 97.9 F (36.6 C), temperature  source Oral, resp. rate 12, height 5\' 5"  (1.651 m), weight 118.4 kg, SpO2 99 %.  Constitutional:  Resting comfortably and in no acute distress. Alert and oriented x3 Cardiovascular:  Regular rate and rhythm. No swelling of the lower extremities.  Respiratory:  Normal respiratory effort. Lungs are clear to auscultation.  Abdominal:  Soft and non-distended. No organomegaly.  Back: Tenderness to palpation in the L2-4 region. No step-off or deformity noted.  Extremities: No asymmetry noted on the lower extremities  MSK: ROM limited to the RLE 2/2 pain. Tenderness to palpation to the right lateral hip.  Skin:  No obvious lesion  noted Neuro: Alert and oriented x3. 4/5 strength in the left lower extremity. 2/5 strength in the right lower extremity. Normal sensation bilaterally. Negative straight leg test bilaterally.  Psych:  Normal mood and behavior  EKG: personally reviewed my interpretation is sinus rhythm with prolonged PR interval   MR Hip Right W WO Contrast  IMPRESSION: 1. Severe end-stage degenerative changes of the right hip with associated reactive subchondral bone marrow edema. Nonspecific moderate-sized complex right hip joint effusion with synovitis, favored reactive. Septic arthritis not entirely excluded. If there is high clinical suspicion for infection, arthrocentesis should be considered. 2. Tendinosis of the bilateral hamstring tendon origins with low-grade partial tears, left worse than right. 3. Findings suggestive of left ischiofemoral impingement. 4. Diffuse marrow heterogeneity without discrete marrow replacing lesion. Findings are nonspecific but can be seen in the setting of chronic anemia, smoking, and/or obesity.  MR Lumbar Spine W WO Contrast  IMPRESSION: 1. No evidence of acute injury. 2. Diffusely heterogeneous marrow signal is favored to be related to osteoporosis. 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 Recommend follow-up MRI with contrast in 3-6 months to ensure stability of the findings. 3. Prominent central protrusion at T12-L1 resulting in moderate to severe spinal canal stenosis. 4. Disc bulge and facet arthropathy at L1-L2 resulting in moderate spinal canal stenosis with crowding of the subarticular zones and moderate left worse than right neural foraminal stenosis. 5. Disc bulge and moderate bilateral facet arthropathy at L2-L3 resulting in severe spinal canal stenosis with compression of the cauda equina nerve roots and moderate left worse than right neural foraminal stenosis. 6. Disc bulge and  severe right facet arthropathy at L4-L5 resulting in effacement of the subarticular zones with possible impingement of either traversing L5 nerve root (right worse than left), and mild bilateral neural foraminal stenosis with possible contact of the exiting right L4 nerve root. 7. Mild bilateral perifacetal soft tissue enhancement at L2-L3 and L3-L4 which could reflect a source of pain. 8. Fibroid uterus and colonic diverticulosis.  Assessment & Plan by Problem: Principal Problem:   Right hip pain  Patient is a 61 y.o. female with history of severe right hip osteoarthritis who is being admitted for acute worsening of right hip pain with low back pain, MR results showing moderate to severe stenosis of the spinal canal, and was additionally noted to have hypokalemia at 2.4.   RLE weakness Acute on chronic hip and low back pain History of severe osteoarthritis of the right hip Patient presents with acute worsening of her right hip pain and low back pain, deteriorating from her baseline where she is now unable to ambulate or bear any weight. At baseline she is able to ambulate using a cane or a walker. On exam, she has tenderness to palpation on the L2-4 region and in the  right lateral hip. Negative straight leg test bilaterally. MR of the spine showed moderate to severe spinal canal stenosis at T12-L3. MR of right hip showed severe end-stage degenerative changes. Initial concern for cauda equina syndrome due to acute onset of low back pain with some urinary incontinence and MR results showing narrowing of the spinal canal however she has been seen by Dr. Annette Stable, neurosurgery, who does not believe these areas cause critical stenosis and appears chronic in nature. Does not believe patient is having true cauda equina dysfunction and therefore no surgical emergency for decompression is indicated. Dr. Annette Stable also believes back pain is exacerbated by her right hip pain 2/2 chronic and severe osteoporosis that  is already end-stage, with shortening duration of therapeutic pain relief with the steroid injections she has been receiving from her orthopedics provider. --NSG following, recommend IV steroid for pain control  --IV decadron 10 mg loading dose, followed by 4 mg q6h --Ortho consulted for further recommendation for end-stage severe right hip osteoarthritis    --left VM on Dr. Sharol Given cell number -Continue pain control with Tylenol 650 mg q6h PRN for mild pain and tramadol 50 mg q6hr  -Check Vitamin D and B12 levels.  -PT/OT eval and treat  Hypokalemia  Patient has history of hypokalemia and is on potassium supplement 10 mEq daily at home. She has been compliant with her home medications however her potassium was noted to be critically low at 2.4 at [facility]. Repeat potassium was 3.0 here in the ED. EKG showed sinus rhythm with prolonged PR interval. Hypokalemia likely 2/2 to HCTZ use. We will hold for now.  -Repeat BMP later tonight -Kcl 40 mEq daily  -Trend BMP -Hold HCTZ  Hypertension Blood pressure mildly elevated at 130s/80s. On metoprolol succinate 25 mg daily and HCTZ 12.5 mg daily.  -Continue home metoprolol succinate and hold HCTZ -Continue to monitor  Hx of Prediabetes A1c in 01/10/21 was 5.7%. Blood sugar of 78 on BMP.  -Pending A1c  GERD Continue protonix 40 mg daily  DVT prophx: Lovenox Diet: Regular Bowel: PRN Code: Full  Prior to Admission Living Arrangement: Home Anticipated Discharge Location: Home Barriers to Discharge: Medical Workup  Dispo: Admit patient to Observation with expected length of stay less than 2 midnights.  Signed: Clydell Hakim, Medical Student 05/15/2022, 4:32 PM  Pager: [phone]  I have reviewed the note by Clydell Hakim MS3 and was present during the interview and physical exam.  I agree with the findings, assessment, and plan.  Lacinda Axon, MD 05/15/2022, 6:57 PM IM Resident, PGY-2 Pager: [phone] Isaiah  41:10

## 2022-05-15 NOTE — Hospital Course (Signed)
Right leg weakness Acute on chronic hip and low back pain History of severe osteoarthritis of the right hip Patient presented after acute worsening of her right hip pain and back pain to the point where she was unable to bear any weight, ambulate, or lift herself up from a sitting position. Pain radiated into her low back and also had associated numbness in the right toes. 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 MRI of right hip showed end-stage degenerative stage. Neurosurgery has seen the patient, reviewed the imaging results, and recommended IV Decadron. IV Decadron was transitioned to oral decadron at time of discharge for 6 additional days. PT eval recommended Home Health PT and supervision for impaired safety awareness. Ortho consult has also seen the patient and recommends follow-up with weight loss clinic to get BMI <40 prior to surgery.  Hypokalemia Patient has history of hypokalemia, on potassium supplement at home. She is on HCTZ 12.5 mg daily for her hypertension. Patient was noted to be severely hypokalemic during this admission with potassium 2.4. She was given potassium supplement and her HCTZ was held during this admission. HCTZ was held at discharge with instructions to follow-up with PCP.  Hypertension Blood pressure remained stable during this admission. Her HCTZ was held due to her hypokalemia and instructed patient to discontinue this medication.    Lisa Macdonald,  You were recently admitted to [facility] for worsening pain in hip and back. Neurosurgery saw you for your back and recommended short course of steroids to help with narrowing of lumbar canal. Ortho surgery saw you as well and recommended weight loss prior to hip surgery. Please talk with your PCP about trying a GLP-1 medication that helps with weight loss.   Continue taking your home medications with the following changes  Start taking- decadron Stop taking- Hydrochlorothiazide until you  are able to follow-up with your PCP  You should seek further medical care if you experience worsening numbness or weakness in your legs.  We recommend that you see your primary care doctor in about a week to recheck lab work prior to restarting blood pressure medication and talk about starting new medication to help with weight loss. Please also follow with neurosurgery in 2 weeks. We are so glad that you are feeling better.  Sincerely, Rudene Christians, DO       Admitted 05/14/2022  Allergies: Patient has no known allergies. Pertinent Hx: severe osteoarthritis of the right hip, hypetension and pre-diabetes who presents to the the hospital for severe right hip pain and low back pain.  61 y.o. female p/w severe R hip pain/low back pain/ LLE weakness  * Radiculopathy/Severe hip OA: MRI showed T12-L3 mod-sev spinal canal stenosis and MR of R hip showed severe end-stage degenerative changes. Started on IV steroid per NSG recs.   Consults: NSG, ortho Meds: Decadron, toprol, tramadol VTE ppx: Lovenox IVF: None Diet: CM

## 2022-05-15 NOTE — ED Notes (Signed)
Pt brought in via [facility] from from [facility]. Pt called EMS today after she went to the bathroom and could not get off toilet. Pt has hx of osteoarthritis and gets injections each month. Pt is Aox4, Vitals stable with [facility]  145/88 HR 75 95% RA  RR 15  Pain 9/10

## 2022-05-15 NOTE — Plan of Care (Signed)

## 2022-05-15 NOTE — Consult Note (Signed)
Reason for Consult: Weakness Referring Physician: Emergency department  Lisa Macdonald is an 61 y.o. female.  HPI: 61 year old female with chronic right hip pain secondary to very severe degenerative disease in her right hip.  Patient has chronically been receiving intra-articular steroid injections.  Currently under consideration for total hip arthroplasty.  Patient presents with increasing feelings of gait instability and weakness.  She states that when she tries to stand and walk her legs feel weak and she has difficulty standing or walking.  She has some numbness and tingling distally in both lower extremities.  She has no sharp radicular pain aside from her right groin and proximal thigh pain.  She has no recent history of accident injury.  She has no fevers or chills or obvious signs of infection.  She has intermittent incontinence secondary to difficulty making it to the bathroom in time but she does have the urge to void and the sensation of urinary fullness.  Past Medical History:  Diagnosis Date   Arthritis    GERD (gastroesophageal reflux disease)    Hypertension     History reviewed. No pertinent surgical history.  Family History  Problem Relation Age of Onset   Healthy Mother     Social History:  reports that she has been smoking. She has never used smokeless tobacco. She reports current alcohol use. She reports that she does not use drugs.  Allergies: No Known Allergies  Medications: I have reviewed the patient's current medications.  Results for orders placed or performed during the hospital encounter of 05/14/22 (from the past 48 hour(s))  CBC with Differential     Status: Abnormal   Collection Time: 05/14/22  9:10 PM  Result Value Ref Range   WBC 7.5 4.0 - 10.5 K/uL   RBC 4.21 3.87 - 5.11 MIL/uL   Hemoglobin 11.2 (L) 12.0 - 15.0 g/dL   HCT 62.7 (L) 03.5 - 00.9 %   MCV 81.9 80.0 - 100.0 fL   MCH 26.6 26.0 - 34.0 pg   MCHC 32.5 30.0 - 36.0 g/dL   RDW 38.1 82.9 -  93.7 %   Platelets 303 150 - 400 K/uL   nRBC 0.0 0.0 - 0.2 %   Neutrophils Relative % 64 %   Neutro Abs 4.9 1.7 - 7.7 K/uL   Lymphocytes Relative 27 %   Lymphs Abs 2.0 0.7 - 4.0 K/uL   Monocytes Relative 6 %   Monocytes Absolute 0.5 0.1 - 1.0 K/uL   Eosinophils Relative 1 %   Eosinophils Absolute 0.1 0.0 - 0.5 K/uL   Basophils Relative 1 %   Basophils Absolute 0.0 0.0 - 0.1 K/uL   Immature Granulocytes 1 %   Abs Immature Granulocytes 0.04 0.00 - 0.07 K/uL    Comment: Performed at [facility], [address], [place], [place] [address]  Basic metabolic panel     Status: Abnormal   Collection Time: 05/14/22  9:10 PM  Result Value Ref Range   Sodium 139 135 - 145 mmol/L   Potassium 2.4 (LL) 3.5 - 5.1 mmol/L    Comment: CRITICAL RESULT CALLED TO, READ BACK BY AND VERIFIED WITH:  Waynette Buttery RN 05/14/22 @ 2145 VS    Chloride 103 98 - 111 mmol/L   CO2 28 22 - 32 mmol/L   Glucose, Bld 95 70 - 99 mg/dL    Comment: Glucose reference range applies only to samples taken after fasting for at least 8 hours.   BUN 9 8 -  23 mg/dL   Creatinine, Ser 6.38 0.44 - 1.00 mg/dL   Calcium 9.2 8.9 - 46.6 mg/dL   GFR, Estimated >59 >93 mL/min    Comment: (NOTE) Calculated using the CKD-EPI Creatinine Equation (2021)    Anion gap 8 5 - 15    Comment: Performed at [facility], [address], [place], [place] [address]  Sedimentation rate     Status: Abnormal   Collection Time: 05/14/22  9:25 PM  Result Value Ref Range   Sed Rate 35 (H) 0 - 22 mm/hr    Comment: Performed at [facility], [address] [place], [place] [address]  C-reactive protein     Status: None   Collection Time: 05/14/22  9:25 PM  Result Value Ref Range   CRP 0.6 <1.0 mg/dL    Comment: Performed at [facility], [address] [place], [place] [address]  Basic metabolic panel     Status: Abnormal   Collection Time: 05/15/22  7:43 AM  Result Value Ref  Range   Sodium 137 135 - 145 mmol/L   Potassium 3.0 (L) 3.5 - 5.1 mmol/L    Comment: SLIGHT HEMOLYSIS   Chloride 111 98 - 111 mmol/L   CO2 22 22 - 32 mmol/L   Glucose, Bld 78 70 - 99 mg/dL    Comment: Glucose reference range applies only to samples taken after fasting for at least 8 hours.   BUN 8 8 - 23 mg/dL   Creatinine, Ser 3.30 0.44 - 1.00 mg/dL   Calcium 8.0 (L) 8.9 - 10.3 mg/dL   GFR, Estimated >07 >62 mL/min    Comment: (NOTE) Calculated using the CKD-EPI Creatinine Equation (2021)    Anion gap 4 (L) 5 - 15    Comment: Performed at [facility], [address] [place], [place] [address]  Magnesium     Status: None   Collection Time: 05/15/22  7:43 AM  Result Value Ref Range   Magnesium 1.8 1.7 - 2.4 mg/dL    Comment: Performed at [facility], [address], [place], [place] [address]    MR Lumbar Spine W Wo Contrast  Result Date: 05/15/2022 CLINICAL DATA:  Sudden onset worsening right hip pain and bilateral lower extremity weakness, right worse than left. EXAM: MRI LUMBAR SPINE WITHOUT AND WITH CONTRAST TECHNIQUE: Multiplanar and multiecho pulse sequences of the lumbar spine were obtained without and with intravenous contrast. CONTRAST:  72mL GADAVIST GADOBUTROL 1 MMOL/ML IV SOLN COMPARISON:  CT abdomen/pelvis 02/13/2014 FINDINGS: Segmentation: There is partial sacralization of the L5 vertebral body. The lowest formed disc space is designated L5-S1. Alignment:  Normal. Vertebrae: Marrow signal is diffusely heterogeneous with patchy T1 hypointensity. There is a 1.2 cm peripherally enhancing lesion in the L5 vertebral body with corresponding n T1 hyperintensity on the precontrast T1 sequence (5-7, 20-6). No correlate finding is seen on the CT abdomen/pelvis from 2015. There is no other abnormal enhancement. Conus medullaris and cauda equina: Conus extends to the T12-L1 level. Conus and cauda equina appear normal. There is no abnormal enhancement of the conus or cauda  equina nerve roots. Paraspinal and other soft tissues: There is mild perifacetal soft tissue enhancement bilaterally at L2-L3 and L3-L4. The paraspinal soft tissues are otherwise unremarkable. The uterus is heterogeneous likely reflecting fibroids. There is colonic diverticulosis. Disc levels: There is multilevel disc desiccation with overall mild loss of height. There is multilevel facet arthropathy, most advanced at L4-L5. T12-L1:  There is a prominent central disc protrusion and bilateral facet arthropathy resulting in moderate to severe spinal canal stenosis without significant neural foraminal stenosis L1-L2: There is a diffuse disc bulge, prominent dorsal epidural fat, and right worse than left facet arthropathy resulting in moderate spinal canal stenosis with crowding of the bilateral subarticular zones and moderate left worse than right neural foraminal stenosis L2-L3: There is a diffuse disc bulge and moderate bilateral facet arthropathy resulting in severe spinal canal stenosis with compression of the cauda equina nerve roots and moderate left worse than right neural foraminal stenosis L3-L4: There is a mild disc bulge and mild bilateral facet arthropathy resulting in mild narrowing of the bilateral subarticular zones and mild-to-moderate left and mild right neural foraminal stenosis L4-L5: There is a mild disc bulge and severe right and moderate left facet arthropathy resulting in effacement of the subarticular zones, right worse than left, with possible impingement of either traversing L5 nerve root, and mild bilateral neural foraminal stenosis with possible contact of the exiting right L4 nerve root by disc material (3-3). L5-S1: No significant spinal canal or neural foraminal stenosis. IMPRESSION: 1. No evidence of acute injury. 2. Diffusely heterogeneous marrow signal is favored to be related to osteoporosis. 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 Recommend follow-up MRI with contrast in 3-6 months to ensure stability of the findings. 3. Prominent central protrusion at T12-L1 resulting in moderate to severe spinal canal stenosis. 4. Disc bulge and facet arthropathy at L1-L2 resulting in moderate spinal canal stenosis with crowding of the subarticular zones and moderate left worse than right neural foraminal stenosis. 5. Disc bulge and moderate bilateral facet arthropathy at L2-L3 resulting in severe spinal canal stenosis with compression of the cauda equina nerve roots and moderate left worse than right neural foraminal stenosis. 6. Disc bulge and severe right facet arthropathy at L4-L5 resulting in effacement of the subarticular zones with possible impingement of either traversing L5 nerve root (right worse than left), and mild bilateral neural foraminal stenosis with possible contact of the exiting right L4 nerve root. 7. Mild bilateral perifacetal soft tissue enhancement at L2-L3 and L3-L4 which could reflect a source of pain. 8. Fibroid uterus and colonic diverticulosis. Electronically Signed   By: Lesia HausenPeter  Noone M.D.   On: 05/15/2022 10:11   MR HIP RIGHT W WO CONTRAST  Result Date: 05/15/2022 CLINICAL DATA:  Atraumatic right hip pain for 3 days EXAM: MRI OF THE RIGHT HIP WITHOUT AND WITH CONTRAST TECHNIQUE: Multiplanar, multisequence MR imaging was performed both before and after administration of intravenous contrast. CONTRAST:  10mL GADAVIST GADOBUTROL 1 MMOL/ML IV SOLN COMPARISON:  X-ray 08/04/2018 FINDINGS: Bones: Prominent reactive subchondral bone marrow edema associated with the right hip joint without definite acute fracture. No dislocation. No femoral head avascular necrosis. Bony pelvis intact without diastasis. Mild joint space narrowing of the left hip. SI joints and pubic symphysis within normal limits. Mild subcortical bone marrow edema of the left ischial tuberosity at site of the left hamstring origin. Diffuse marrow  heterogeneity without discrete marrow replacing lesion. Findings are nonspecific but can be seen in the setting of chronic anemia, smoking, and/or obesity. Articular cartilage and labrum Articular cartilage: Severe end-stage degenerative changes of the right hip with diffuse full-thickness cartilage loss, bulky marginal osteophytes, and extensive subchondral cystic changes along both sides of the joint. Subchondral bone marrow edema is more pronounced within the right acetabulum. Labrum:  Diffusely degenerated and  torn. Joint or bursal effusion Joint effusion: Moderate-sized complex right hip joint effusion with enhancing synovitis, nonspecific. Bursae: No abnormal bursal fluid collection. Muscles and tendons Muscles and tendons: Tendinosis of the bilateral hamstring tendon origins with low-grade partial tears, left worse than right. The gluteal, iliopsoas, rectus femoris, and adductor tendons appear intact without tear or significant tendinosis. Narrowing of the left ischiofemoral space with focal intramuscular edema within the left quadratus femoris muscle. Generalized muscle atrophy. Other findings Miscellaneous: No soft tissue edema or fluid collection. No inguinal lymphadenopathy. Colonic diverticulosis. Multiple small uterine fibroids. IMPRESSION: 1. Severe end-stage degenerative changes of the right hip with associated reactive subchondral bone marrow edema. Nonspecific moderate-sized complex right hip joint effusion with synovitis, favored reactive. Septic arthritis not entirely excluded. If there is high clinical suspicion for infection, arthrocentesis should be considered. 2. Tendinosis of the bilateral hamstring tendon origins with low-grade partial tears, left worse than right. 3. Findings suggestive of left ischiofemoral impingement. 4. Diffuse marrow heterogeneity without discrete marrow replacing lesion. Findings are nonspecific but can be seen in the setting of chronic anemia, smoking, and/or  obesity. Electronically Signed   By: Duanne Guess D.O.   On: 05/15/2022 10:44    Pertinent items noted in HPI and remainder of comprehensive ROS otherwise negative. Blood pressure 140/86, pulse 70, temperature 97.9 F (36.6 C), temperature source Oral, resp. rate 14, height  (1.651 m), weight 118.4 kg, SpO2 99 %. The patient is awake and alert.  She is oriented and appropriate.  She is very cooperative and pleasant during the exam.  Examination of her cranial nerve function is normal bilaterally.  Motor examination of the extremities reveals good hip flexion strength bilaterally.  She has at least 4/5 strength in her quadriceps bilaterally.  She has some moderate dorsiflexion weakness on the right side.  She has good dorsiflexion strength on the left side.  She has complete plantarflexion strength bilaterally.  She has good toe flexion bilaterally.  She has severe pain with external rotation of her right hip.  Straight raising is equivocal.  Reflexes are hypoactive.  Sensory examination reveals some patchy distal sensory loss in both distal lower extremities.  She has what appears to be reasonably normal sacral sensation.  Assessment/Plan: The patient poses a difficult clinical situation.  She certainly has significant spinal stenosis predominantly at L2-3 but also at T12-L1 and also at L4-5.  None of these areas cause critical stenosis and all these findings appear to be chronic in nature without anything to suggest new structural worsening.  She also has an MRI scan of her right hip with very severe osteoarthritic disease and some changes worrisome for septic arthritis although I think this may just represent severe degenerative change.  The patient has moderately severe spinal stenosis worse at the L2-3 level.  Although a good measure her symptoms may be coming from her stenosis I do not feel the patient is having any true cauda equina dysfunction and there is no surgical emergency to  decompress her spinal canal.  I would recommend hospital admission and placement on IV steroids for hopeful treatment of her stenosis.  I would also recommend that she be evaluated by orthopedics with regard to her right hip pathology as I think this is her main factor with regard to her poor ambulation.  I will continue to follow.  Sherilyn Cooter A Chael Urenda 05/15/2022, 1:58 PM

## 2022-05-15 NOTE — Progress Notes (Addendum)
Received patient from ED via stretcher.  Lateral transfer to bed with 3-person assist.  Assisted in position of comfort.  Right hip pain rated 7/10 during transfer activity.  Oriented to room and unit routine, needs addressed.

## 2022-05-15 NOTE — ED Provider Notes (Signed)
[facility] EMERGENCY DEPARTMENT Provider Note   CSN: [id number] Arrival date & time: 05/14/22  1756     History  Chief Complaint  Patient presents with   Hip Pain    Lisa Macdonald is a 61 y.o. female who presents in transfer from [facility] long emergency department for MRI of the lumbar spine and hip.  In brief patient is a 61 year old female with chronic right hip pain secondary to osteoarthritis who undergoes recurrent hip injections most recently 4 weeks ago who presents now with sudden worsening of her pain and new onset bilateral lower extremity weakness right greater than left 3 days ago.  Unable to perform ADLs secondary to weakness.  Per chart review patient did have asymmetric worsened weakness on the right compared to the left in the lower extremities with midline tenderness by palpation of the lumbar spine prompting ordering of MRI.  In addition to the above listed history patient has history of iron deficiency anemia and hypokalemia on oral potassium daily.  On metoprolol and HCTZ for her hypertension. She is not anticoagulated.   HPI     Home Medications Prior to Admission medications   Medication Sig Start Date End Date Taking? Authorizing Provider  ARTIFICIAL TEAR OP Place 1 drop into both eyes daily.   Yes [provider]  Aspirin-Acetaminophen-Caffeine (GOODYS EXTRA STRENGTH PO) Take 1 packet by mouth daily as needed (headache).   Yes [provider]  cetirizine (ZYRTEC) 10 MG tablet Take 10 mg by mouth daily as needed for allergies. 09/15/21  Yes [provider]  FEROSUL 325 (65 Fe) MG tablet Take 325 mg by mouth daily. 12/12/20  Yes [provider]  folic acid (FOLVITE) 1 MG tablet Take 1 mg by mouth daily. 12/18/21  Yes [provider]  hydrochlorothiazide (HYDRODIURIL) 12.5 MG tablet Take 12.5 mg by mouth every evening. 03/19/22  Yes [provider]  ibuprofen (ADVIL) 800 MG tablet Take 800 mg  by mouth 3 (three) times daily as needed for headache or moderate pain. 12/06/21  Yes [provider]  metoprolol succinate (TOPROL-XL) 25 MG 24 hr tablet Take 25 mg by mouth every evening. 05/12/22  Yes [provider]  omeprazole (PRILOSEC) 20 MG capsule Take 20 mg by mouth daily.   Yes [provider]  potassium chloride (KLOR-CON) 10 MEQ tablet Take 10 mEq by mouth daily. 03/30/20  Yes [provider]  Vitamin D, Ergocalciferol, (DRISDOL) 1.25 MG (50000 UNIT) CAPS capsule Take 50,000 Units by mouth every Monday. 11/29/21  Yes [provider]      Allergies    Patient has no known allergies.    Review of Systems   Review of Systems  Constitutional:  Positive for chills.  Musculoskeletal:  Positive for back pain and gait problem.  Neurological:  Positive for weakness.   Physical Exam Updated Vital Signs BP 105/82 (BP Location: Right Arm)   Pulse 75   Temp 97.9 F (36.6 C) (Oral)   Resp 17   Ht 5\' 5"  (1.651 m)   Wt 118.4 kg   SpO2 100%   BMI 43.43 kg/m  Physical Exam Vitals and nursing note reviewed.  Constitutional:      Appearance: She is not ill-appearing or toxic-appearing.  HENT:     Head: Normocephalic and atraumatic.     Mouth/Throat:     Mouth: Mucous membranes are moist.     Pharynx: No oropharyngeal exudate or posterior oropharyngeal erythema.  Eyes:  General:        Right eye: No discharge.        Left eye: No discharge.     Conjunctiva/sclera: Conjunctivae normal.     Pupils: Pupils are equal, round, and reactive to light.  Cardiovascular:     Rate and Rhythm: Normal rate and regular rhythm.     Pulses: Normal pulses.     Heart sounds: Normal heart sounds. No murmur heard. Pulmonary:     Effort: Pulmonary effort is normal. No respiratory distress.     Breath sounds: Normal breath sounds. No wheezing or rales.  Abdominal:     General: Bowel sounds are normal. There is no distension.     Palpations: Abdomen  is soft.     Tenderness: There is no abdominal tenderness. There is no guarding or rebound.  Musculoskeletal:        General: No deformity.     Cervical back: Neck supple.     Lumbar back: Tenderness and bony tenderness present. Negative right straight leg raise test and negative left straight leg raise test.       Legs:  Skin:    General: Skin is warm and dry.     Capillary Refill: Capillary refill takes less than 2 seconds.     Findings: No rash.     Comments: No skin changes over lower back or hip  Neurological:     General: No focal deficit present.     Mental Status: She is alert and oriented to person, place, and time. Mental status is at baseline.     GCS: GCS eye subscore is 4. GCS verbal subscore is 5. GCS motor subscore is 6.     Comments: Gait deferred secondary to weakness, pain, and fall risk.   Psychiatric:        Mood and Affect: Mood normal.    ED Results / Procedures / Treatments   Labs (all labs ordered are listed, but only abnormal results are displayed) Labs Reviewed  CBC WITH DIFFERENTIAL/PLATELET - Abnormal; Notable for the following components:      Result Value   Hemoglobin 11.2 (*)    HCT 34.5 (*)    All other components within normal limits  BASIC METABOLIC PANEL - Abnormal; Notable for the following components:   Potassium 2.4 (*)    All other components within normal limits  SEDIMENTATION RATE - Abnormal; Notable for the following components:   Sed Rate 35 (*)    All other components within normal limits  C-REACTIVE PROTEIN    EKG None  Radiology No results found.  Procedures Procedures    Medications Ordered in ED Medications  potassium chloride SA (KLOR-CON M) CR tablet 40 mEq (40 mEq Oral Given 05/15/22 0124)  fentaNYL (SUBLIMAZE) injection 50 mcg (50 mcg Intravenous Given 05/14/22 2328)  potassium chloride 10 mEq in 100 mL IVPB (0 mEq Intravenous Stopped 05/15/22 0340)    ED Course/ Medical Decision Making/ A&P                            Medical Decision Making 61 year old female presents with concern for low back pain and right hip pain with new weakness this week without symptoms of cauda equina.  Presents of afebrile and intake.  Vital signs otherwise normal.  Cardiopulmonary and abdominal exams are benign.  Musculoskeletal midsegment as above with concern for midline tenderness palpation of the lumbar spine as well as pain over the right  hip without skin changes.  The emergent differential diagnosis for low back pain includes acute ligamentous and muscular injury, cord compression syndrome, pathologic fracture, transverse myelitis, vertebral osteomyelitis, discitis, and epidural abscess.   Problems Addressed: Acute midline low back pain, unspecified whether sciatica present: acute illness or injury Right hip pain: chronic illness or injury Right leg weakness: acute illness or injury Unilateral primary osteoarthritis, right hip: chronic illness or injury  Amount and/or Complexity of Data Reviewed External Data Reviewed: notes. Labs: ordered.    Details: CBC without leukocytosis with mild anemia with hemoglobin 11.2 near patient's baseline.  BMP with severe hypokalemia to 2.4, will replete both IV and p.o.  Sed rate mildly elevated to 35, CRP normal. Radiology: ordered. ECG/medicine tests: ordered.  Risk Prescription drug management.   Care of this patient signed out to oncoming ED provider Raynald Blend, PA-C at time of shift change.  She is pending MRIs as she was at the time of her arrival in transfer from [facility] long emergency department.  Disposition pending imaging and repeat neuro evaluation.  This chart was dictated using voice recognition software, Dragon. Despite the best efforts of this provider to proofread and correct errors, errors may still occur which can change documentation meaning.   Final Clinical Impression(s) / ED Diagnoses Final diagnoses:  Right leg weakness  Right hip pain   Unilateral primary osteoarthritis, right hip  Essential hypertension  Acute midline low back pain, unspecified whether sciatica present    Rx / DC Orders ED Discharge Orders     None         Paris Lore, PA-C 05/15/22 [id number]    Sabas Sous, MD 05/15/22 0730

## 2022-05-15 NOTE — ED Provider Notes (Signed)
Care assumed from [place] PA at shift change, please see their note for full detail, but in brief Lisa Macdonald is a 61 y.o. female presents with pain and weakness of the lower extremities, worse on the right.  Plan: Pending MRI, if normal, repeat neuro eval  Physical Exam  BP 105/82 (BP Location: Right Arm)   Pulse 75   Temp 97.9 F (36.6 C) (Oral)   Resp 17   Ht 5\' 5"  (1.651 m)   Wt 118.4 kg   SpO2 100%   BMI 43.43 kg/m   Physical Exam  Procedures  Procedures  ED Course / MDM    Medical Decision Making Amount and/or Complexity of Data Reviewed Labs: ordered. Radiology: ordered.  Risk Prescription drug management. Decision regarding hospitalization.   MRI of lumbar spine and hip return.  There is significant degenerative changes of the right hip, but additionally there is compression of the L2-L3 of the cauda equina spinal roots.  I repeated neuro evaluation, and patient is unable to straight leg raise off of the table due to weakness.  I consulted with our neurosurgeon, Dr. Trenton Gammon who came to evaluate the patient.  After evaluation, he does not believe that patient is suffering from cauda equina syndrome but does believe that she needs to be admitted for treatment.  He recommends to admit patient for IV steroids and have Ortho consult regarding patient's hip which he believes is likely the primary cause for patient's inability to stand.  Discussed plan with patient who is amenable.  I spoke with internal medicine who agrees to admit patient.       Lisa Macdonald, [place] 05/15/22 1532    Godfrey Pick, MD 05/20/22 [phone]

## 2022-05-15 NOTE — ED Notes (Signed)
ED TO INPATIENT HANDOFF REPORT  ED Nurse Name and Phone #: Lysbeth Galas [phone]  S Name/Age/Gender Lisa Macdonald 61 y.o. female Room/Bed: 019C/019C  Code Status   Code Status: Full Code  Home/SNF/Other Home Patient oriented to: self, place, time, and situation Is this baseline? Yes   Triage Complete: Triage complete  Chief Complaint Right hip pain [M25.551]  Triage Note Pt with history of degenerative joint disease with right hip being per patient bon on bone. For the past three days she has had severe pain that radiated into her back now. Pt goes every three months for hip injections last receiving them in April. Pt called 911 today because she couldn't stand up from the toilet.   Allergies No Known Allergies  Level of Care/Admitting Diagnosis ED Disposition     ED Disposition  Admit   Condition  --   Comment  Hospital Area: [facility] [[id number]]  Level of Care: Med-Surg [16]  May place patient in observation at [facility] or [facility] if equivalent level of care is available:: No  Covid Evaluation: Confirmed COVID Negative  Diagnosis: Right hip pain [id number]  Admitting Physician: Lottie Mussel [id number]  Attending Physician: Lottie Mussel [id number]          B Medical/Surgery History Past Medical History:  Diagnosis Date   Arthritis    GERD (gastroesophageal reflux disease)    Hypertension    History reviewed. No pertinent surgical history.   A IV Location/Drains/Wounds Patient Lines/Drains/Airways Status     Active Line/Drains/Airways     Name Placement date Placement time Site Days   Peripheral IV 05/14/22 20 G 1" Right Antecubital 05/14/22  2110  Antecubital  1            Intake/Output Last 24 hours No intake or output data in the 24 hours ending 05/15/22 1640  Labs/Imaging Results for orders placed or performed during the hospital encounter of 05/14/22 (from the past 48 hour(s))  CBC with Differential     Status:  Abnormal   Collection Time: 05/14/22  9:10 PM  Result Value Ref Range   WBC 7.5 4.0 - 10.5 K/uL   RBC 4.21 3.87 - 5.11 MIL/uL   Hemoglobin 11.2 (L) 12.0 - 15.0 g/dL   HCT 34.5 (L) 36.0 - 46.0 %   MCV 81.9 80.0 - 100.0 fL   MCH 26.6 26.0 - 34.0 pg   MCHC 32.5 30.0 - 36.0 g/dL   RDW 13.8 11.5 - 15.5 %   Platelets 303 150 - 400 K/uL   nRBC 0.0 0.0 - 0.2 %   Neutrophils Relative % 64 %   Neutro Abs 4.9 1.7 - 7.7 K/uL   Lymphocytes Relative 27 %   Lymphs Abs 2.0 0.7 - 4.0 K/uL   Monocytes Relative 6 %   Monocytes Absolute 0.5 0.1 - 1.0 K/uL   Eosinophils Relative 1 %   Eosinophils Absolute 0.1 0.0 - 0.5 K/uL   Basophils Relative 1 %   Basophils Absolute 0.0 0.0 - 0.1 K/uL   Immature Granulocytes 1 %   Abs Immature Granulocytes 0.04 0.00 - 0.07 K/uL    Comment: Performed at [facility], [place] [address], [place], [place] [id number]  Basic metabolic panel     Status: Abnormal   Collection Time: 05/14/22  9:10 PM  Result Value Ref Range   Sodium 139 135 - 145 mmol/L   Potassium 2.4 (LL) 3.5 - 5.1 mmol/L    Comment: CRITICAL RESULT CALLED  TO, READ BACK BY AND VERIFIED WITH:  Orbie Pyo RN 05/14/22 @ 2145 VS    Chloride 103 98 - 111 mmol/L   CO2 28 22 - 32 mmol/L   Glucose, Bld 95 70 - 99 mg/dL    Comment: Glucose reference range applies only to samples taken after fasting for at least 8 hours.   BUN 9 8 - 23 mg/dL   Creatinine, Ser 0.66 0.44 - 1.00 mg/dL   Calcium 9.2 8.9 - 10.3 mg/dL   GFR, Estimated >60 >60 mL/min    Comment: (NOTE) Calculated using the CKD-EPI Creatinine Equation (2021)    Anion gap 8 5 - 15    Comment: Performed at [facility], [place] [address] [place], [place] [address]  Sedimentation rate     Status: Abnormal   Collection Time: 05/14/22  9:25 PM  Result Value Ref Range   Sed Rate 35 (H) 0 - 22 mm/hr    Comment: Performed at [facility], [place] [address] [place], [place] [address]  C-reactive  protein     Status: None   Collection Time: 05/14/22  9:25 PM  Result Value Ref Range   CRP 0.6 <1.0 mg/dL    Comment: Performed at [facility], [place] [address] [place], [place] [id number]  Basic metabolic panel     Status: Abnormal   Collection Time: 05/15/22  7:43 AM  Result Value Ref Range   Sodium 137 135 - 145 mmol/L   Potassium 3.0 (L) 3.5 - 5.1 mmol/L    Comment: SLIGHT HEMOLYSIS   Chloride 111 98 - 111 mmol/L   CO2 22 22 - 32 mmol/L   Glucose, Bld 78 70 - 99 mg/dL    Comment: Glucose reference range applies only to samples taken after fasting for at least 8 hours.   BUN 8 8 - 23 mg/dL   Creatinine, Ser 0.49 0.44 - 1.00 mg/dL   Calcium 8.0 (L) 8.9 - 10.3 mg/dL   GFR, Estimated >60 >60 mL/min    Comment: (NOTE) Calculated using the CKD-EPI Creatinine Equation (2021)    Anion gap 4 (L) 5 - 15    Comment: Performed at [facility] [address], [employer] [place], [place] [address]  Magnesium     Status: None   Collection Time: 05/15/22  7:43 AM  Result Value Ref Range   Magnesium 1.8 1.7 - 2.4 mg/dL    Comment: Performed at [facility] [address], [place], [place] [address]   MR Lumbar Spine W Wo Contrast  Result Date: 05/15/2022 CLINICAL DATA:  Sudden onset worsening right hip pain and bilateral lower extremity weakness, right worse than left. EXAM: MRI LUMBAR SPINE WITHOUT AND WITH CONTRAST TECHNIQUE: Multiplanar and multiecho pulse sequences of the lumbar spine were obtained without and with intravenous contrast. CONTRAST:  27mL GADAVIST GADOBUTROL 1 MMOL/ML IV SOLN COMPARISON:  CT abdomen/pelvis 02/13/2014 FINDINGS: Segmentation: There is partial sacralization of the L5 vertebral body. The lowest formed disc space is designated L5-S1. Alignment:  Normal. Vertebrae: Marrow signal is diffusely heterogeneous with patchy T1 hypointensity. There is a 1.2 cm peripherally enhancing lesion in the L5 vertebral body with corresponding n T1 hyperintensity on the  precontrast T1 sequence (5-7, 20-6). No correlate finding is seen on the CT abdomen/pelvis from 2015. There is no other abnormal enhancement. Conus medullaris and cauda equina: Conus extends to the T12-L1 level. Conus and cauda equina appear normal. There is no abnormal enhancement of  the conus or cauda equina nerve roots. Paraspinal and other soft tissues: There is mild perifacetal soft tissue enhancement bilaterally at L2-L3 and L3-L4. The paraspinal soft tissues are otherwise unremarkable. The uterus is heterogeneous likely reflecting fibroids. There is colonic diverticulosis. Disc levels: There is multilevel disc desiccation with overall mild loss of height. There is multilevel facet arthropathy, most advanced at L4-L5. T12-L1: There is a prominent central disc protrusion and bilateral facet arthropathy resulting in moderate to severe spinal canal stenosis without significant neural foraminal stenosis L1-L2: There is a diffuse disc bulge, prominent dorsal epidural fat, and right worse than left facet arthropathy resulting in moderate spinal canal stenosis with crowding of the bilateral subarticular zones and moderate left worse than right neural foraminal stenosis L2-L3: There is a diffuse disc bulge and moderate bilateral facet arthropathy resulting in severe spinal canal stenosis with compression of the cauda equina nerve roots and moderate left worse than right neural foraminal stenosis L3-L4: There is a mild disc bulge and mild bilateral facet arthropathy resulting in mild narrowing of the bilateral subarticular zones and mild-to-moderate left and mild right neural foraminal stenosis L4-L5: There is a mild disc bulge and severe right and moderate left facet arthropathy resulting in effacement of the subarticular zones, right worse than left, with possible impingement of either traversing L5 nerve root, and mild bilateral neural foraminal stenosis with possible contact of the exiting right L4 nerve root by  disc material (3-3). L5-S1: No significant spinal canal or neural foraminal stenosis. IMPRESSION: 1. No evidence of acute injury. 2. Diffusely heterogeneous marrow signal is favored to be related to osteoporosis. 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 Recommend follow-up MRI with contrast in 3-6 months to ensure stability of the findings. 3. Prominent central protrusion at T12-L1 resulting in moderate to severe spinal canal stenosis. 4. Disc bulge and facet arthropathy at L1-L2 resulting in moderate spinal canal stenosis with crowding of the subarticular zones and moderate left worse than right neural foraminal stenosis. 5. Disc bulge and moderate bilateral facet arthropathy at L2-L3 resulting in severe spinal canal stenosis with compression of the cauda equina nerve roots and moderate left worse than right neural foraminal stenosis. 6. Disc bulge and severe right facet arthropathy at L4-L5 resulting in effacement of the subarticular zones with possible impingement of either traversing L5 nerve root (right worse than left), and mild bilateral neural foraminal stenosis with possible contact of the exiting right L4 nerve root. 7. Mild bilateral perifacetal soft tissue enhancement at L2-L3 and L3-L4 which could reflect a source of pain. 8. Fibroid uterus and colonic diverticulosis. Electronically Signed   By: Valetta Mole M.D.   On: 05/15/2022 10:11   MR HIP RIGHT W WO CONTRAST  Result Date: 05/15/2022 CLINICAL DATA:  Atraumatic right hip pain for 3 days EXAM: MRI OF THE RIGHT HIP WITHOUT AND WITH CONTRAST TECHNIQUE: Multiplanar, multisequence MR imaging was performed both before and after administration of intravenous contrast. CONTRAST:  45mL GADAVIST GADOBUTROL 1 MMOL/ML IV SOLN COMPARISON:  X-ray 08/04/2018 FINDINGS: Bones: Prominent reactive subchondral bone marrow edema associated with the right hip joint without definite acute fracture. No  dislocation. No femoral head avascular necrosis. Bony pelvis intact without diastasis. Mild joint space narrowing of the left hip. SI joints and pubic symphysis within normal limits. Mild subcortical bone marrow edema of the left ischial tuberosity at site of the left hamstring origin. Diffuse marrow heterogeneity without discrete marrow replacing lesion.  Findings are nonspecific but can be seen in the setting of chronic anemia, smoking, and/or obesity. Articular cartilage and labrum Articular cartilage: Severe end-stage degenerative changes of the right hip with diffuse full-thickness cartilage loss, bulky marginal osteophytes, and extensive subchondral cystic changes along both sides of the joint. Subchondral bone marrow edema is more pronounced within the right acetabulum. Labrum:  Diffusely degenerated and torn. Joint or bursal effusion Joint effusion: Moderate-sized complex right hip joint effusion with enhancing synovitis, nonspecific. Bursae: No abnormal bursal fluid collection. Muscles and tendons Muscles and tendons: Tendinosis of the bilateral hamstring tendon origins with low-grade partial tears, left worse than right. The gluteal, iliopsoas, rectus femoris, and adductor tendons appear intact without tear or significant tendinosis. Narrowing of the left ischiofemoral space with focal intramuscular edema within the left quadratus femoris muscle. Generalized muscle atrophy. Other findings Miscellaneous: No soft tissue edema or fluid collection. No inguinal lymphadenopathy. Colonic diverticulosis. Multiple small uterine fibroids. IMPRESSION: 1. Severe end-stage degenerative changes of the right hip with associated reactive subchondral bone marrow edema. Nonspecific moderate-sized complex right hip joint effusion with synovitis, favored reactive. Septic arthritis not entirely excluded. If there is high clinical suspicion for infection, arthrocentesis should be considered. 2. Tendinosis of the bilateral  hamstring tendon origins with low-grade partial tears, left worse than right. 3. Findings suggestive of left ischiofemoral impingement. 4. Diffuse marrow heterogeneity without discrete marrow replacing lesion. Findings are nonspecific but can be seen in the setting of chronic anemia, smoking, and/or obesity. Electronically Signed   By: Davina Poke D.O.   On: 05/15/2022 10:44    Pending Labs Unresulted Labs (From admission, onward)     Start     Ordered   05/16/22 0500  CBC  Tomorrow morning,   R        05/15/22 1603   05/16/22 [id number]  Basic metabolic panel  Tomorrow morning,   R        05/15/22 1603   05/15/22 1600  Phosphorus  Once,   R        05/15/22 1603   05/15/22 1543  Hemoglobin A1c  Once,   R        05/15/22 1603   05/15/22 1543  Lipid panel  Once,   R        05/15/22 1603   05/15/22 1543  TSH  Once,   R        05/15/22 1603   05/15/22 1541  VITAMIN D 25 Hydroxy (Vit-D Deficiency, Fractures)  Once,   R        05/15/22 1603   05/15/22 1541  Hepatic function panel  Once,   R        05/15/22 1603   05/15/22 1541  Vitamin B12  Once,   R        05/15/22 1603   05/15/22 1530  HIV Antibody (routine testing w rflx)  (HIV Antibody (Routine testing w reflex) panel)  Once,   R        05/15/22 1603            Vitals/Pain Today's Vitals   05/15/22 0815 05/15/22 1015 05/15/22 1245 05/15/22 1445  BP: (!) 127/97 129/86 140/86 133/85  Pulse: 92 75 70 73  Resp: (!) 22 15 14 12   Temp:      TempSrc:      SpO2: 99% 96% 99% 99%  Weight:      Height:      PainSc:  Isolation Precautions No active isolations  Medications Medications  potassium chloride SA (KLOR-CON M) CR tablet 40 mEq (40 mEq Oral Given 05/15/22 1005)  enoxaparin (LOVENOX) injection 40 mg (has no administration in time range)  acetaminophen (TYLENOL) tablet 650 mg (has no administration in time range)    Or  acetaminophen (TYLENOL) suppository 650 mg (has no administration in time range)   senna-docusate (Senokot-S) tablet 1 tablet (has no administration in time range)  ondansetron (ZOFRAN) tablet 4 mg (has no administration in time range)    Or  ondansetron (ZOFRAN) injection 4 mg (has no administration in time range)  fentaNYL (SUBLIMAZE) injection 50 mcg (50 mcg Intravenous Given 05/14/22 2328)  potassium chloride 10 mEq in 100 mL IVPB (0 mEq Intravenous Stopped 05/15/22 0340)  gadobutrol (GADAVIST) 1 MMOL/ML injection 10 mL (10 mLs Intravenous Contrast Given 05/15/22 0947)    Mobility walks with person assist Moderate fall risk   Focused Assessments Neuro Assessment Handoff:  Swallow screen pass? Yes          Neuro Assessment: Within Defined Limits Neuro Checks:      Last Documented NIHSS Modified Score:   Has TPA been given? No If patient is a Neuro Trauma and patient is going to OR before floor call report to [place] nurse: [phone] or [phone]   R Recommendations: See Admitting Provider Note  Report given to:   Additional Notes: purwick on due to weakness

## 2022-05-15 NOTE — ED Notes (Signed)
Pt is in MRI  

## 2022-05-16 DIAGNOSIS — M25551 Pain in right hip: Secondary | ICD-10-CM

## 2022-05-16 DIAGNOSIS — R29898 Other symptoms and signs involving the musculoskeletal system: Secondary | ICD-10-CM

## 2022-05-16 DIAGNOSIS — I10 Essential (primary) hypertension: Secondary | ICD-10-CM

## 2022-05-16 DIAGNOSIS — M1611 Unilateral primary osteoarthritis, right hip: Secondary | ICD-10-CM | POA: Diagnosis not present

## 2022-05-16 DIAGNOSIS — Z87891 Personal history of nicotine dependence: Secondary | ICD-10-CM

## 2022-05-16 LAB — BASIC METABOLIC PANEL
Anion gap: 5 (ref 5–15)
BUN: 10 mg/dL (ref 8–23)
CO2: 25 mmol/L (ref 22–32)
Calcium: 9.6 mg/dL (ref 8.9–10.3)
Chloride: 106 mmol/L (ref 98–111)
Creatinine, Ser: 0.62 mg/dL (ref 0.44–1.00)
GFR, Estimated: >60 mL/min (ref >60)
Glucose, Bld: 122 mg/dL — ABNORMAL HIGH (ref 70–99)
Potassium: 4 mmol/L (ref 3.5–5.1)
Sodium: 136 mmol/L (ref 135–145)

## 2022-05-16 LAB — HEMOGLOBIN A1C
Hgb A1c MFr Bld: 5.5 % (ref 4.8–5.6)
Mean Plasma Glucose: 111.15 mg/dL

## 2022-05-16 LAB — CBC
HCT: 35.2 % — ABNORMAL LOW (ref 36.0–46.0)
Hemoglobin: 11.3 g/dL — ABNORMAL LOW (ref 12.0–15.0)
MCH: 25.9 pg — ABNORMAL LOW (ref 26.0–34.0)
MCHC: 32.1 g/dL (ref 30.0–36.0)
MCV: 80.7 fL (ref 80.0–100.0)
Platelets: 301 10*3/uL (ref 150–400)
RBC: 4.36 MIL/uL (ref 3.87–5.11)
RDW: 13.7 % (ref 11.5–15.5)
WBC: 8.2 10*3/uL (ref 4.0–10.5)
nRBC: 0 % (ref 0.0–0.2)

## 2022-05-16 MED ORDER — DEXAMETHASONE 4 MG PO TABS
4.0000 mg | ORAL_TABLET | Freq: Four times a day (QID) | ORAL | Status: DC
  Administered 2022-05-16 – 2022-05-18 (×7): 4 mg via ORAL
  Filled 2022-05-16 (×8): qty 1

## 2022-05-16 MED ORDER — ACETAMINOPHEN 500 MG PO TABS
1000.0000 mg | ORAL_TABLET | Freq: Three times a day (TID) | ORAL | Status: DC
  Administered 2022-05-16 – 2022-05-18 (×6): 1000 mg via ORAL
  Filled 2022-05-16 (×6): qty 2

## 2022-05-16 MED ORDER — NAPROXEN 250 MG PO TABS
500.0000 mg | ORAL_TABLET | Freq: Three times a day (TID) | ORAL | Status: DC
  Administered 2022-05-16 – 2022-05-18 (×6): 500 mg via ORAL
  Filled 2022-05-16 (×6): qty 2

## 2022-05-16 NOTE — Consult Note (Addendum)
Reason for Consult: Right hip pain Referring Physician: Hospitalist  Lisa Macdonald is an 61 y.o. female.  HPI: 61 year old black female is being seen for right hip pain.  Patient has a known history of end-stage DJD right hip and this is well documented in her chart.  She was previously seen by orthopedic surgeon Dr. Doneen Poissonhristopher Blackman in our office October 2019 for her hip.  At that time he recommended that patient see a bariatric specialist before any surgical intervention for hip due to BMI over 40.  Patient does admit to seeing a couple of other surgeons for hip and surgery still was not done.  She never went to go see a bariatric specialist.  She is continue to have ongoing pain over the last several years.  When she was seen by Dr. Magnus IvanBlackman in 2019 he documented her x-ray as showing severe arthritic changes of the right hip.  There is significant joint space narrowing as well with periarticular osteophytes and cystic changes.  She is also had right hip MRI during this hospital admission and findings are documented below.  Dr. Ophelia CharterYates is already reviewed patient's chart along with her imaging studies.  Past Medical History:  Diagnosis Date   Arthritis    GERD (gastroesophageal reflux disease)    Hypertension     History reviewed. No pertinent surgical history.  Family History  Problem Relation Age of Onset   Healthy Mother     Social History:  reports that she has been smoking. She has never used smokeless tobacco. She reports current alcohol use. She reports that she does not use drugs.  Allergies: No Known Allergies  Medications: I have reviewed the patient's current medications.  Results for orders placed or performed during the hospital encounter of 05/14/22 (from the past 48 hour(s))  CBC with Differential     Status: Abnormal   Collection Time: 05/14/22  9:10 PM  Result Value Ref Range   WBC 7.5 4.0 - 10.5 K/uL   RBC 4.21 3.87 - 5.11 MIL/uL   Hemoglobin 11.2 (L) 12.0 -  15.0 g/dL   HCT 16.134.5 (L) 09.636.0 - 04.546.0 %   MCV 81.9 80.0 - 100.0 fL   MCH 26.6 26.0 - 34.0 pg   MCHC 32.5 30.0 - 36.0 g/dL   RDW 40.913.8 81.111.5 - 91.415.5 %   Platelets 303 150 - 400 K/uL   nRBC 0.0 0.0 - 0.2 %   Neutrophils Relative % 64 %   Neutro Abs 4.9 1.7 - 7.7 K/uL   Lymphocytes Relative 27 %   Lymphs Abs 2.0 0.7 - 4.0 K/uL   Monocytes Relative 6 %   Monocytes Absolute 0.5 0.1 - 1.0 K/uL   Eosinophils Relative 1 %   Eosinophils Absolute 0.1 0.0 - 0.5 K/uL   Basophils Relative 1 %   Basophils Absolute 0.0 0.0 - 0.1 K/uL   Immature Granulocytes 1 %   Abs Immature Granulocytes 0.04 0.00 - 0.07 K/uL    Comment: Performed at [facility], [address] [place], [place] [id number]  Basic metabolic panel     Status: Abnormal   Collection Time: 05/14/22  9:10 PM  Result Value Ref Range   Sodium 139 135 - 145 mmol/L   Potassium 2.4 (LL) 3.5 - 5.1 mmol/L    Comment: CRITICAL RESULT CALLED TO, READ BACK BY AND VERIFIED WITH:  Waynette ButteryASHLEY ELLWANGER RN 05/14/22 @ 2145 VS    Chloride 103 98 - 111 mmol/L   CO2 28 22 -  32 mmol/L   Glucose, Bld 95 70 - 99 mg/dL    Comment: Glucose reference range applies only to samples taken after fasting for at least 8 hours.   BUN 9 8 - 23 mg/dL   Creatinine, Ser 4.81 0.44 - 1.00 mg/dL   Calcium 9.2 8.9 - 85.6 mg/dL   GFR, Estimated >31 >49 mL/min    Comment: (NOTE) Calculated using the CKD-EPI Creatinine Equation (2021)    Anion gap 8 5 - 15    Comment: Performed at [facility], [address], [place], [place] [address]  Sedimentation rate     Status: Abnormal   Collection Time: 05/14/22  9:25 PM  Result Value Ref Range   Sed Rate 35 (H) 0 - 22 mm/hr    Comment: Performed at [facility], [address], [place], [place] [address]  C-reactive protein     Status: None   Collection Time: 05/14/22  9:25 PM  Result Value Ref Range   CRP 0.6 <1.0 mg/dL    Comment: Performed at [facility],  [address] [place], [place] [address]  Basic metabolic panel     Status: Abnormal   Collection Time: 05/15/22  7:43 AM  Result Value Ref Range   Sodium 137 135 - 145 mmol/L   Potassium 3.0 (L) 3.5 - 5.1 mmol/L    Comment: SLIGHT HEMOLYSIS   Chloride 111 98 - 111 mmol/L   CO2 22 22 - 32 mmol/L   Glucose, Bld 78 70 - 99 mg/dL    Comment: Glucose reference range applies only to samples taken after fasting for at least 8 hours.   BUN 8 8 - 23 mg/dL   Creatinine, Ser 4.12 0.44 - 1.00 mg/dL   Calcium 8.0 (L) 8.9 - 10.3 mg/dL   GFR, Estimated >87 >86 mL/min    Comment: (NOTE) Calculated using the CKD-EPI Creatinine Equation (2021)    Anion gap 4 (L) 5 - 15    Comment: Performed at [facility], [address] [place], [place] [address]  Magnesium     Status: None   Collection Time: 05/15/22  7:43 AM  Result Value Ref Range   Magnesium 1.8 1.7 - 2.4 mg/dL    Comment: Performed at [facility], [address], [place], [place] [address]  HIV Antibody (routine testing w rflx)     Status: None   Collection Time: 05/15/22  4:32 PM  Result Value Ref Range   HIV Screen 4th Generation wRfx Non Reactive Non Reactive    Comment: Performed at [facility], [address] [place], [place] [address]  VITAMIN D 25 Hydroxy (Vit-D Deficiency, Fractures)     Status: None   Collection Time: 05/15/22  4:32 PM  Result Value Ref Range   Vit D, 25-Hydroxy 43.85 30 - 100 ng/mL    Comment: (NOTE) Vitamin D deficiency has been defined by the Institute of Medicine  and an Endocrine Society practice guideline as a level of serum 25-OH  vitamin D less than 20 ng/mL (1,2). The Endocrine Society went on to  further define vitamin D insufficiency as a level between 21 and 29  ng/mL (2).  1. IOM (Institute of Medicine). 2010. Dietary reference intakes for  calcium and D. [place]: [employer]. 2. Holick MF, Binkley Hilliard, Bischoff-Ferrari HA, et al. Evaluation,  treatment,  and prevention of vitamin D deficiency: an Endocrine  Society clinical practice guideline,  JCEM. 2011 Jul; 96(7): 1911-30.  Performed at [facility], [address], [place], [place] [address]   Hepatic function panel     Status: Abnormal   Collection Time: 05/15/22  4:32 PM  Result Value Ref Range   Total Protein 6.7 6.5 - 8.1 g/dL   Albumin 3.2 (L) 3.5 - 5.0 g/dL   AST 15 15 - 41 U/L   ALT 16 0 - 44 U/L   Alkaline Phosphatase 82 38 - 126 U/L   Total Bilirubin 0.5 0.3 - 1.2 mg/dL   Bilirubin, Direct 0.1 0.0 - 0.2 mg/dL   Indirect Bilirubin 0.4 0.3 - 0.9 mg/dL    Comment: Performed at [facility], [address], [place], [place] [address]  Vitamin B12     Status: None   Collection Time: 05/15/22  4:32 PM  Result Value Ref Range   Vitamin B-12 299 180 - 914 pg/mL    Comment: (NOTE) This assay is not validated for testing neonatal or myeloproliferative syndrome specimens for Vitamin B12 levels. Performed at [facility], [address] [place], [place] [address]   Lipid panel     Status: Abnormal   Collection Time: 05/15/22  4:32 PM  Result Value Ref Range   Cholesterol 172 0 - 200 mg/dL   Triglycerides 49 <914 mg/dL   HDL 49 >78 mg/dL   Total CHOL/HDL Ratio 3.5 RATIO   VLDL 10 0 - 40 mg/dL   LDL Cholesterol 295 (H) 0 - 99 mg/dL    Comment:        Total Cholesterol/HDL:CHD Risk Coronary Heart Disease Risk Table                     Men   Women  1/2 Average Risk   3.4   3.3  Average Risk       5.0   4.4  2 X Average Risk   9.6   7.1  3 X Average Risk  23.4   11.0        Use the calculated Patient Ratio above and the CHD Risk Table to determine the patient's CHD Risk.        ATP III CLASSIFICATION (LDL):  <100     mg/dL   Optimal  621-308  mg/dL   Near or Above                    Optimal  130-159  mg/dL   Borderline  657-846  mg/dL   High  >962     mg/dL   Very High Performed at [facility], [address] [place], [place] [address]    TSH     Status: None   Collection Time: 05/15/22  4:32 PM  Result Value Ref Range   TSH 2.202 0.350 - 4.500 uIU/mL    Comment: Performed by a 3rd Generation assay with a functional sensitivity of <=0.01 uIU/mL. Performed at [facility], [address], [place], [place] [address]   Phosphorus     Status: None   Collection Time: 05/15/22  4:32 PM  Result Value Ref Range   Phosphorus 3.3 2.5 - 4.6 mg/dL    Comment: Performed at [facility], [address] [place], [place] [address]  Basic metabolic panel     Status: Abnormal   Collection Time: 05/15/22  7:46 PM  Result Value Ref Range   Sodium 140 135 - 145  mmol/L   Potassium 3.4 (L) 3.5 - 5.1 mmol/L   Chloride 105 98 - 111 mmol/L   CO2 27 22 - 32 mmol/L   Glucose, Bld 90 70 - 99 mg/dL    Comment: Glucose reference range applies only to samples taken after fasting for at least 8 hours.   BUN 10 8 - 23 mg/dL   Creatinine, Ser 1.61 0.44 - 1.00 mg/dL   Calcium 9.6 8.9 - 09.6 mg/dL   GFR, Estimated >04 >54 mL/min    Comment: (NOTE) Calculated using the CKD-EPI Creatinine Equation (2021)    Anion gap 8 5 - 15    Comment: Performed at [facility], [address], [place], [place] [address]  Hemoglobin A1c     Status: None   Collection Time: 05/16/22  3:30 AM  Result Value Ref Range   Hgb A1c MFr Bld 5.5 4.8 - 5.6 %    Comment: (NOTE) Pre diabetes:          5.7%-6.4%  Diabetes:              >6.4%  Glycemic control for   <7.0% adults with diabetes    Mean Plasma Glucose 111.15 mg/dL    Comment: Performed at [facility], [address] [place], [place] [address]  CBC     Status: Abnormal   Collection Time: 05/16/22  3:30 AM  Result Value Ref Range   WBC 8.2 4.0 - 10.5 K/uL   RBC 4.36 3.87 - 5.11 MIL/uL   Hemoglobin 11.3 (L) 12.0 - 15.0 g/dL   HCT 29.5 (L) 62.1 - 30.8 %   MCV 80.7 80.0 - 100.0 fL   MCH 25.9 (L) 26.0 - 34.0 pg   MCHC 32.1 30.0 - 36.0 g/dL   RDW 65.7 84.6 - 96.2 %   Platelets  301 150 - 400 K/uL   nRBC 0.0 0.0 - 0.2 %    Comment: Performed at [facility], [address], [place], [place] [address]  Basic metabolic panel     Status: Abnormal   Collection Time: 05/16/22  3:30 AM  Result Value Ref Range   Sodium 136 135 - 145 mmol/L   Potassium 4.0 3.5 - 5.1 mmol/L   Chloride 106 98 - 111 mmol/L   CO2 25 22 - 32 mmol/L   Glucose, Bld 122 (H) 70 - 99 mg/dL    Comment: Glucose reference range applies only to samples taken after fasting for at least 8 hours.   BUN 10 8 - 23 mg/dL   Creatinine, Ser 1.32 0.44 - 1.00 mg/dL   Calcium 9.6 8.9 - 44.0 mg/dL   GFR, Estimated >10 >27 mL/min    Comment: (NOTE) Calculated using the CKD-EPI Creatinine Equation (2021)    Anion gap 5 5 - 15    Comment: Performed at [facility], [address] [place], [place] [address]    MR Lumbar Spine W Wo Contrast  Result Date: 05/15/2022 CLINICAL DATA:  Sudden onset worsening right hip pain and bilateral lower extremity weakness, right worse than left. EXAM: MRI LUMBAR SPINE WITHOUT AND WITH CONTRAST TECHNIQUE: Multiplanar and multiecho pulse sequences of the lumbar spine were obtained without and with intravenous contrast. CONTRAST:  10mL GADAVIST GADOBUTROL 1 MMOL/ML IV SOLN COMPARISON:  CT abdomen/pelvis 02/13/2014 FINDINGS: Segmentation: There is partial sacralization of the L5 vertebral body. The lowest formed disc space is designated L5-S1. Alignment:  Normal. Vertebrae: Marrow signal is diffusely heterogeneous with  patchy T1 hypointensity. There is a 1.2 cm peripherally enhancing lesion in the L5 vertebral body with corresponding n T1 hyperintensity on the precontrast T1 sequence (5-7, 20-6). No correlate finding is seen on the CT abdomen/pelvis from 2015. There is no other abnormal enhancement. Conus medullaris and cauda equina: Conus extends to the T12-L1 level. Conus and cauda equina appear normal. There is no abnormal enhancement of the conus or cauda equina nerve  roots. Paraspinal and other soft tissues: There is mild perifacetal soft tissue enhancement bilaterally at L2-L3 and L3-L4. The paraspinal soft tissues are otherwise unremarkable. The uterus is heterogeneous likely reflecting fibroids. There is colonic diverticulosis. Disc levels: There is multilevel disc desiccation with overall mild loss of height. There is multilevel facet arthropathy, most advanced at L4-L5. T12-L1: There is a prominent central disc protrusion and bilateral facet arthropathy resulting in moderate to severe spinal canal stenosis without significant neural foraminal stenosis L1-L2: There is a diffuse disc bulge, prominent dorsal epidural fat, and right worse than left facet arthropathy resulting in moderate spinal canal stenosis with crowding of the bilateral subarticular zones and moderate left worse than right neural foraminal stenosis L2-L3: There is a diffuse disc bulge and moderate bilateral facet arthropathy resulting in severe spinal canal stenosis with compression of the cauda equina nerve roots and moderate left worse than right neural foraminal stenosis L3-L4: There is a mild disc bulge and mild bilateral facet arthropathy resulting in mild narrowing of the bilateral subarticular zones and mild-to-moderate left and mild right neural foraminal stenosis L4-L5: There is a mild disc bulge and severe right and moderate left facet arthropathy resulting in effacement of the subarticular zones, right worse than left, with possible impingement of either traversing L5 nerve root, and mild bilateral neural foraminal stenosis with possible contact of the exiting right L4 nerve root by disc material (3-3). L5-S1: No significant spinal canal or neural foraminal stenosis. IMPRESSION: 1. No evidence of acute injury. 2. Diffusely heterogeneous marrow signal is favored to be related to osteoporosis. 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  Recommend follow-up MRI with contrast in 3-6 months to ensure stability of the findings. 3. Prominent central protrusion at T12-L1 resulting in moderate to severe spinal canal stenosis. 4. Disc bulge and facet arthropathy at L1-L2 resulting in moderate spinal canal stenosis with crowding of the subarticular zones and moderate left worse than right neural foraminal stenosis. 5. Disc bulge and moderate bilateral facet arthropathy at L2-L3 resulting in severe spinal canal stenosis with compression of the cauda equina nerve roots and moderate left worse than right neural foraminal stenosis. 6. Disc bulge and severe right facet arthropathy at L4-L5 resulting in effacement of the subarticular zones with possible impingement of either traversing L5 nerve root (right worse than left), and mild bilateral neural foraminal stenosis with possible contact of the exiting right L4 nerve root. 7. Mild bilateral perifacetal soft tissue enhancement at L2-L3 and L3-L4 which could reflect a source of pain. 8. Fibroid uterus and colonic diverticulosis. Electronically Signed   By: Lesia Hausen M.D.   On: 05/15/2022 10:11   MR HIP RIGHT W WO CONTRAST  Result Date: 05/15/2022 CLINICAL DATA:  Atraumatic right hip pain for 3 days EXAM: MRI OF THE RIGHT HIP WITHOUT AND WITH CONTRAST TECHNIQUE: Multiplanar, multisequence MR imaging was performed both before and after administration of intravenous contrast. CONTRAST:  10mL GADAVIST GADOBUTROL 1 MMOL/ML IV SOLN COMPARISON:  X-ray 08/04/2018 FINDINGS: Bones: Prominent  reactive subchondral bone marrow edema associated with the right hip joint without definite acute fracture. No dislocation. No femoral head avascular necrosis. Bony pelvis intact without diastasis. Mild joint space narrowing of the left hip. SI joints and pubic symphysis within normal limits. Mild subcortical bone marrow edema of the left ischial tuberosity at site of the left hamstring origin. Diffuse marrow heterogeneity  without discrete marrow replacing lesion. Findings are nonspecific but can be seen in the setting of chronic anemia, smoking, and/or obesity. Articular cartilage and labrum Articular cartilage: Severe end-stage degenerative changes of the right hip with diffuse full-thickness cartilage loss, bulky marginal osteophytes, and extensive subchondral cystic changes along both sides of the joint. Subchondral bone marrow edema is more pronounced within the right acetabulum. Labrum:  Diffusely degenerated and torn. Joint or bursal effusion Joint effusion: Moderate-sized complex right hip joint effusion with enhancing synovitis, nonspecific. Bursae: No abnormal bursal fluid collection. Muscles and tendons Muscles and tendons: Tendinosis of the bilateral hamstring tendon origins with low-grade partial tears, left worse than right. The gluteal, iliopsoas, rectus femoris, and adductor tendons appear intact without tear or significant tendinosis. Narrowing of the left ischiofemoral space with focal intramuscular edema within the left quadratus femoris muscle. Generalized muscle atrophy. Other findings Miscellaneous: No soft tissue edema or fluid collection. No inguinal lymphadenopathy. Colonic diverticulosis. Multiple small uterine fibroids. IMPRESSION: 1. Severe end-stage degenerative changes of the right hip with associated reactive subchondral bone marrow edema. Nonspecific moderate-sized complex right hip joint effusion with synovitis, favored reactive. Septic arthritis not entirely excluded. If there is high clinical suspicion for infection, arthrocentesis should be considered. 2. Tendinosis of the bilateral hamstring tendon origins with low-grade partial tears, left worse than right. 3. Findings suggestive of left ischiofemoral impingement. 4. Diffuse marrow heterogeneity without discrete marrow replacing lesion. Findings are nonspecific but can be seen in the setting of chronic anemia, smoking, and/or obesity.  Electronically Signed   By: Duanne Guess D.O.   On: 05/15/2022 10:44    Review of Systems  Constitutional:  Positive for activity change.  HENT: Negative.    Respiratory: Negative.    Musculoskeletal:  Positive for gait problem.  Psychiatric/Behavioral: Negative.    Blood pressure (!) 150/95, pulse 86, temperature 98.3 F (36.8 C), temperature source Oral, resp. rate 16, height 5\' 5"  (1.651 m), weight 116.8 kg, SpO2 100 %. Physical Exam Constitutional:      Appearance: She is obese.  HENT:     Head: Normocephalic and atraumatic.     Nose: Nose normal.  Eyes:     Extraocular Movements: Extraocular movements intact.  Pulmonary:     Effort: No respiratory distress.  Musculoskeletal:     Comments: Patient has right hip/groin pain with about 10 degrees internal/external rotation.  Neurological:     Mental Status: She is alert and oriented to person, place, and time.    Assessment/Plan: End-stage DJD right hip and chronic pain. Morbid obesity with BMI 42.85.  I advised patient that before she can have surgical intervention with right hip replacement that she needs to get her BMI down.  At this point I would not recommend she have any further intra-articular injections but she can discuss this further with Dr. after her discharge or any of the other orthopedic surgeons that she has seen after Dr. Magnus Ivan in 2019.  Dr. 2020 also did come up and speak with patient and he also did discuss patient going to [facility] weight loss center.  Ophelia Charter 05/16/2022, 2:51 PM  Discussed with patient in detail that she has 2 problems 1 is spinal stenosis and lumbar region for which she is seeing Dr. Dutch Quint.  The other problem is right hip osteoarthritis and she needs to lose weight get down to 240 pounds and she has about 15 pounds to go before she is eligible for hip arthroplasty.  We discussed with her the restrictions on BMI which she is not necessarily in agreement but is understanding  and agrees to proceed with weight loss so that she can proceed with hip arthroplasty.  Discussed with her that she needs to be seen a weight loss center.  She is already lost some weight and dropped her BMI one-point and now is in a position where she has 15 pounds left.  She can follow-up with Dr. Magnus Ivan when she gets close to her BMI goal.

## 2022-05-16 NOTE — Discharge Summary (Shared)
Name: Lisa Macdonald MRN: [id number] DOB: 04-May-1961 61 y.o. PCP: Fleet Contras, MD  Date of Admission: 05/14/2022  6:05 PM Date of Discharge: No discharge date for patient encounter. Attending Physician: Mercie Eon, MD  Discharge Diagnosis: 1.  Principal Problem:   Right hip pain   Discharge Medications: Allergies as of 05/16/2022   No Known Allergies   Med Rec must be completed prior to using this [facility]***       Disposition and follow-up:   Ms.Lisa Macdonald was discharged from [facility] in Stable condition.  At the hospital follow up visit please address:  1.  Right hip osteoarthritis. End-stage at this time and patient requires surgery for definitive treatment however does not qualify due to her weight. Patient may explore options such as weight-loss clinic . She may qualify for GLP-1 receptor agonist with her history of pre-diabetes and morbid obesity.   2.  Labs / imaging needed at time of follow-up: Repeat BMP with PCP for hypokalemia  3.  Pending labs/ test needing follow-up: N/A  Follow-up Appointments: N/A  Hospital Course by problem list:  Right leg weakness Acute on chronic hip and low back pain History of severe osteoarthritis of the right hip Patient presented after acute worsening of her right hip pain and back pain to the point where she was unable to bear any weight, ambulate, or lift herself up from a sitting position. Pain radiated into her low back and also had associated numbness in the right toes. 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 MR of right hip showed end-stage degenerative stage. Neurosurgery has seen the patient, reviewed the imaging results, and recommended IV Decadron. IV Decadron was transitioned to oral *** at time of discharge. PT eval recommended Home Health PT and supervision for impaired safety awareness. Ortho consult has also seen the patient and recommends  ***  Hypokalemia Patient has history of hypokalemia, on potassium supplement at home. She is on HCTZ 12.5 mg daily for her hypertension. Patient was noted to be severely hypokalemic during this admission with potassium 2.4. She was given potassium supplement and her HCTZ was held during this admission.   Hypertension Blood pressure remained stable during this admission. Her HCTZ was held due to her hypokalemia and instructed patient to discontinue this medication.   Discharge Subjective:  Patient is feeling improved in her pain this morning compared to yesterday however continues to have severe pain in her right hip and low back. She was seen by PT and was able to ambulate from her bed to the recliner with assistance. She denies other complaints at this time with no chest pain or shortness of breath.   Discharge Exam:   BP 126/79 (BP Location: Left Arm)   Pulse 71   Temp 97.8 F (36.6 C) (Oral)   Resp 17   Ht 5\' 5"  (1.651 m)   Wt 116.8 kg   SpO2 100%   BMI 42.85 kg/m  Physical Exam:   Constitutional:  Alert and oriented x3. In no acute distress.  Respiratory:  No increased work of breathing. In no respiratory distress.  Abdominal:  Non-distended. No organomegaly MSK:  RLE 3/5 strength, LLE 4/5 strength, improved from prior exam. Limited RLE ROM 2/2 pain.  Skin:  No obvious lesion noted Psych:  Normal mood and behavior  Pertinent Labs, Studies, and Procedures:  MR Hip Right W WO Contrast  IMPRESSION: 1. Severe end-stage degenerative changes of the right hip with  associated reactive subchondral bone marrow edema. Nonspecific moderate-sized complex right hip joint effusion with synovitis, favored reactive. Septic arthritis not entirely excluded. If there is high clinical suspicion for infection, arthrocentesis should be considered. 2. Tendinosis of the bilateral hamstring tendon origins with low-grade partial tears, left worse than right. 3. Findings suggestive of left  ischiofemoral impingement. 4. Diffuse marrow heterogeneity without discrete marrow replacing lesion. Findings are nonspecific but can be seen in the setting of chronic anemia, smoking, and/or obesity.   MR Lumbar Spine W WO Contrast  IMPRESSION: 1. No evidence of acute injury. 2. Diffusely heterogeneous marrow signal is favored to be related to osteoporosis. 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 Recommend follow-up MRI with contrast in 3-6 months to ensure stability of the findings. 3. Prominent central protrusion at T12-L1 resulting in moderate to severe spinal canal stenosis. 4. Disc bulge and facet arthropathy at L1-L2 resulting in moderate spinal canal stenosis with crowding of the subarticular zones and moderate left worse than right neural foraminal stenosis. 5. Disc bulge and moderate bilateral facet arthropathy at L2-L3 resulting in severe spinal canal stenosis with compression of the cauda equina nerve roots and moderate left worse than right neural foraminal stenosis. 6. Disc bulge and severe right facet arthropathy at L4-L5 resulting in effacement of the subarticular zones with possible impingement of either traversing L5 nerve root (right worse than left), and mild bilateral neural foraminal stenosis with possible contact of the exiting right L4 nerve root. 7. Mild bilateral perifacetal soft tissue enhancement at L2-L3 and L3-L4 which could reflect a source of pain. 8. Fibroid uterus and colonic diverticulosis.  Discharge Instructions: Hello [name],   Thank you for allowing [name] to be part of your care. As you know, you were admitted for your right hip pain and low back pain. The MRI of your spine showed narrowing of the spine that is likely chronic and was treated with steroids here in the hospital. Additionally the MRI of your right hip showed severe arthritis of your right hip. Unfortunately, the  only definitive treatment for this is a hip surgery.   For your hip pain, we are discharging you on Toradol which is an NSAID, to better manage your pain. You are also being discharged on a course of oral steroids that you will take for *** days for the narrowing of your spine.   Signed:  05/16/2022, 11:43 AM   Pager: @[email or web address] @

## 2022-05-16 NOTE — Progress Notes (Signed)
Occupational Therapy Evaluation Patient Details Name: Lisa Macdonald MRN: [id number] DOB: Jan 09, 1961 Today's Date: 05/16/2022   History of Present Illness: Pt is a 61 y/o F presenting with R hip and low back pain - MRI revealed severe stenosis of the spinal canal and degenerative changes at R hip. PMH includes OA of R hip, hypertension, and pre-diabetes.     Clinical Impression: Prior to this admission, patient was living independently with daughters occasionally providing assist with lower body ADLs when pain increased. Currently, patient requiring mod A of 2 to stand, and mod A for ADLs. Patient with significant difficultly motor planning with DME, requiring max cues to follow commands and despite cues remained inconsistent with execution. OT recommending HHOT at discharge with 24/7 supervision for safety. OT will continue to follow acutely.           05/16/22 0800  OT Visit Information  Last OT Received On 05/16/22  Assistance Needed +2  History of Present Illness Pt is a 61 y/o F presenting with R hip and low back pain - MRI revealed severe stenosis of the spinal canal and degenerative changes at R hip. PMH includes OA of R hip, hypertension, and pre-diabetes.  Precautions  Precautions Fall  Restrictions  Weight Bearing Restrictions No  Home Living  Family/patient expects to be discharged to: Private residence  Living Arrangements Children  Available Help at Discharge Family (Daughters (one is autistic, but family can help))  Type of Home House  Home Access Stairs to enter  Entrance Stairs-Number of Steps 4  Entrance Stairs-Rails Right (ascending)  Home Layout One level  Bathroom Tax adviser - single point;Rollator (4 wheels);BSC/3in1  Additional Comments was walking with assist, rollator or cane  Prior Function  Prior Level of Function  Driving;Independent/Modified Independent  Mobility Comments walked with a  rollator or cane  ADLs Comments used motorized scooter in grocery store, able to do all ADLs and IADLs with extra time  Communication  Communication No difficulties  Pain Assessment  Pain Assessment 0-10  Pain Score 8  Pain Location R leg and hip  Pain Descriptors / Indicators Discomfort;Grimacing;Guarding  Pain Intervention(s) Limited activity within patient's tolerance;Monitored during session;Repositioned  Cognition  Arousal/Alertness Awake/alert  Behavior During Therapy WFL for tasks assessed/performed  Overall Cognitive Status Within Functional Limits for tasks assessed  General Comments hyperverbal and requires repetition and cues for problem solving  Upper Extremity Assessment  Upper Extremity Assessment Generalized weakness  Lower Extremity Assessment  Lower Extremity Assessment Defer to PT evaluation  Cervical / Trunk Assessment  Cervical / Trunk Assessment Other exceptions  Cervical / Trunk Exceptions Increased body habitus  Vision- History  Baseline Vision/History 0 No visual deficits  Ability to See in Adequate Light 0 Adequate  Patient Visual Report No change from baseline  ADL  Overall ADL's  Needs assistance/impaired  Eating/Feeding Set up;Sitting  Grooming Set up;Sitting  Upper Body Bathing Set up;Sitting  Lower Body Bathing Maximal assistance;Moderate assistance;Sit to/from stand;Sitting/lateral leans  Upper Body Dressing  Set up;Sitting  Lower Body Dressing Moderate assistance;Maximal assistance;Sitting/lateral leans  Toilet Transfer Moderate assistance;+2 for physical assistance;+2 for safety/equipment;Ambulation;Rolling walker (2 wheels)  Toilet Transfer Details (indicate cue type and reason) simluated to recliner, requiring increased assist to come into standing, then able to take incremental steps and max cues to manage RW  Toileting- Clothing Manipulation and Hygiene Minimal assistance;Moderate assistance;Sitting/lateral lean;Sit to/from stand  Functional  mobility during ADLs Moderate assistance;+2 for physical  assistance;+2 for safety/equipment;Cueing for safety;Rolling walker (2 wheels);Cueing for sequencing  General ADL Comments Patient presenting with R hip pain, decreased activity tolerance, and significant difficulty motor planning with DME  Bed Mobility  Overal bed mobility Needs Assistance  Bed Mobility Supine to Sit  Supine to sit Supervision  General bed mobility comments supervision for safety, using bed rail throughout  Transfers  Overall transfer level Needs assistance  Equipment used Rolling walker (2 wheels)  Transfers Sit to/from Stand  Sit to Stand Mod assist;+2 physical assistance;+2 safety/equipment  General transfer comment stood from elevated EOB with mod A +2. pt required cues for hand placement on RW and bed as well as RW safety. Pt frequently lifting RW from floor and letting go with hands  Balance  Overall balance assessment Needs assistance  Sitting-balance support Bilateral upper extremity supported;Feet supported  Sitting balance-Leahy Scale Fair  Standing balance support During functional activity;Bilateral upper extremity supported;Reliant on assistive device for balance  Standing balance-Leahy Scale Poor  Standing balance comment pt able to maintain static standing balance with mod of 2 fading to min/mod of 1. Pt required min A +2 for dynamic standing balance while ambulating  OT - End of Session  Equipment Utilized During Treatment Gait belt;Rolling walker (2 wheels)  Activity Tolerance Patient tolerated treatment well;Patient limited by pain  Patient left in chair;with call bell/phone within reach;with family/visitor present  Nurse Communication Mobility status  OT Assessment  OT Recommendation/Assessment Patient needs continued OT Services  OT Visit Diagnosis Unsteadiness on feet (R26.81);Other abnormalities of gait and mobility (R26.89);Muscle weakness (generalized) (M62.81);Pain  Pain - Right/Left Right   Pain - part of body Hip  OT Problem List Decreased strength;Decreased range of motion;Decreased activity tolerance;Impaired balance (sitting and/or standing);Decreased coordination;Decreased cognition;Decreased safety awareness;Decreased knowledge of use of DME or AE;Decreased knowledge of precautions;Obesity;Pain  OT Plan  OT Frequency (ACUTE ONLY) Min 2X/week  OT Treatment/Interventions (ACUTE ONLY) Self-care/ADL training;Therapeutic exercise;Energy conservation;DME and/or AE instruction;Manual therapy;Cognitive remediation/compensation;Patient/family education;Balance training  AM-PAC OT "6 Clicks" Daily Activity Outcome Measure (Version 2)  Help from another person eating meals? 3  Help from another person taking care of personal grooming? 3  Help from another person toileting, which includes using toliet, bedpan, or urinal? 2  Help from another person bathing (including washing, rinsing, drying)? 2  Help from another person to put on and taking off regular upper body clothing? 3  Help from another person to put on and taking off regular lower body clothing? 2  6 Click Score 15  Progressive Mobility  What is the highest level of mobility based on the progressive mobility assessment? Level 5 (Walks with assist in room/hall) - Balance while stepping forward/back and can walk in room with assist - Complete  Activity Ambulated with assistance in room  OT Recommendation  Follow Up Recommendations Home health OT  Assistance recommended at discharge Frequent or constant Supervision/Assistance  Patient can return home with the following A lot of help with walking and/or transfers;A lot of help with bathing/dressing/bathroom;Assistance with cooking/housework;Assist for transportation;Help with stairs or ramp for entrance  Functional Status Assessent Patient has had a recent decline in their functional status and demonstrates the ability to make significant improvements in function in a reasonable  and predictable amount of time.  OT Equipment Other (comment)  Individuals Consulted  Consulted and Agree with Results and Recommendations Patient  Acute Rehab OT Goals  Patient Stated Goal to get this pain under control  OT Goal Formulation With patient  Time For  Goal Achievement 05/30/22  Potential to Achieve Goals Fair  OT Time Calculation  OT Start Time (ACUTE ONLY) 0836  OT Stop Time (ACUTE ONLY) 0902  OT Time Calculation (min) 26 min  OT General Charges  $OT Visit 1 Visit  OT Evaluation  $OT Eval Moderate Complexity 1 Mod   Pollyann Glen E. Mailani Degroote, OTR/L Acute Rehabilitation Services [phone]

## 2022-05-16 NOTE — Evaluation (Signed)
Physical Therapy Evaluation Patient Details Name: Lisa Macdonald MRN: [id number] DOB: 28-Jan-1961 Today's Date: 05/16/2022  History of Present Illness  Pt is a 61 y/o F presenting with R hip and low back pain - MRI revealed severe stenosis of the spinal canal and degenerative changes at R hip. PMH includes OA of R hip, hypertension, and pre-diabetes.  Clinical Impression  Pt seen for co-treatment with OT. Received pt long sitting in bed and hyperverbal, but easily re-directed. Pt performed bed mobility mod I using bed features and stood with RW and mod A +2. Pt ambulated in room with min A +2 for safety and left sitting in recliner with all needs within reach. Pt required mod/max cues for RW safety (to keep RW on floor, to push it across floor rather than picking it up, and to use it to bear weight through arms - overall indicating poor problem solving and awareness). Recommend HHPT at this time as well as need for 24/7 supervision due to impaired safety awareness. Acute PT to cont to follow.      Recommendations for follow up therapy are one component of a multi-disciplinary discharge planning process, led by the attending physician.  Recommendations may be updated based on patient status, additional functional criteria and insurance authorization.  Follow Up Recommendations Home health PT    Assistance Recommended at Discharge Frequent or constant Supervision/Assistance  Patient can return home with the following  A lot of help with walking and/or transfers;A lot of help with bathing/dressing/bathroom;Direct supervision/assist for medications management;Direct supervision/assist for financial management;Assist for transportation;Help with stairs or ramp for entrance    Equipment Recommendations Rolling walker (2 wheels)  Recommendations for Other Services       Functional Status Assessment Patient has had a recent decline in their functional status and demonstrates the ability to make  significant improvements in function in a reasonable and predictable amount of time.     Precautions / Restrictions Precautions Precautions: Fall Restrictions Weight Bearing Restrictions: No      Mobility  Bed Mobility Overal bed mobility: Modified Independent             General bed mobility comments: long sitting in bed upon entry and able to transition from long sitt<>sitting EOB mod I using bed features Patient Response: Cooperative  Transfers Overall transfer level: Needs assistance Equipment used: Rolling walker (2 wheels) Transfers: Sit to/from Stand Sit to Stand: Mod assist, +2 physical assistance, +2 safety/equipment           General transfer comment: stood from elevated EOB with mod A +2. pt required cues for hand placement on RW and bed as well as RW safety. Pt frequently lifting RW from floor and letting go with hands    Ambulation/Gait Ambulation/Gait assistance: Min assist, +2 physical assistance, +2 safety/equipment Gait Distance (Feet): 8 Feet Assistive device: Rolling walker (2 wheels) Gait Pattern/deviations: Decreased step length - right, Decreased step length - left, Decreased stance time - right, Decreased stride length, Decreased weight shift to right, Antalgic, Narrow base of support Gait velocity: decreased Gait velocity interpretation: <1.31 ft/sec, indicative of household ambulator Pre-gait activities: standing marches General Gait Details: pt required cues for sequencing to advance RW  Stairs            Wheelchair Mobility    Modified Rankin (Stroke Patients Only)       Balance Overall balance assessment: Needs assistance Sitting-balance support: Bilateral upper extremity supported, Feet supported Sitting balance-Leahy Scale: Fair Sitting balance - Comments:  sitting EOB with distant supervision   Standing balance support: Bilateral upper extremity supported, During functional activity (RW) Standing balance-Leahy Scale:  Poor Standing balance comment: pt able to maintain static standing balance with mod of 2 fading to min/mod of 1. Pt required min A +2 for dynamic standing balance while ambulating                             Pertinent Vitals/Pain Pain Assessment Pain Assessment: 0-10 Pain Score: 8  Pain Location: R leg and hip Pain Descriptors / Indicators: Discomfort, Grimacing, Guarding, Aching Pain Intervention(s): Limited activity within patient's tolerance, Monitored during session, Repositioned    Home Living Family/patient expects to be discharged to:: Private residence Living Arrangements: Children (2 daughters) Available Help at Discharge: Family Type of Home: House Home Access: Stairs to enter Entrance Stairs-Rails: Right (wall on L side) Entrance Stairs-Number of Steps: 4   Home Layout: One level Home Equipment: Cane - single point;Rollator (4 wheels);BSC/3in1 Additional Comments: was walking with assist, rollator or cane    Prior Function Prior Level of Function : Driving;Independent/Modified Independent             Mobility Comments: walked with a rollator or cane ADLs Comments: used motorized scooter in grocery store, able to do all ADLs and IADLs with extra time     Hand Dominance        Extremity/Trunk Assessment   Upper Extremity Assessment Upper Extremity Assessment: Defer to OT evaluation    Lower Extremity Assessment Lower Extremity Assessment: Generalized weakness;RLE deficits/detail RLE Deficits / Details: decreased hip flexion strength and reports of decreased sensation along R foot RLE Sensation: decreased light touch    Cervical / Trunk Assessment Cervical / Trunk Assessment: Normal  Communication   Communication: No difficulties  Cognition Arousal/Alertness: Awake/alert Behavior During Therapy: WFL for tasks assessed/performed Overall Cognitive Status: Within Functional Limits for tasks assessed                                  General Comments: hyperverbal and requires repetition and cues for problem solving        General Comments General comments (skin integrity, edema, etc.): Pt family present at bedside    Exercises     Assessment/Plan    PT Assessment Patient needs continued PT services  PT Problem List Decreased strength;Decreased cognition;Decreased range of motion;Decreased activity tolerance;Decreased balance;Decreased mobility;Decreased coordination;Decreased knowledge of use of DME;Decreased safety awareness;Impaired sensation;Obesity;Pain       PT Treatment Interventions DME instruction;Gait training;Stair training;Functional mobility training;Therapeutic activities;Therapeutic exercise;Balance training;Neuromuscular re-education;Cognitive remediation;Patient/family education    PT Goals (Current goals can be found in the Care Plan section)  Acute Rehab PT Goals Patient Stated Goal: to return home PT Goal Formulation: With patient Time For Goal Achievement: 05/23/22 Potential to Achieve Goals: Fair    Frequency Min 3X/week     Co-evaluation               AM-PAC PT "6 Clicks" Mobility  Outcome Measure Help needed turning from your back to your side while in a flat bed without using bedrails?: None Help needed moving from lying on your back to sitting on the side of a flat bed without using bedrails?: A Little Help needed moving to and from a bed to a chair (including a wheelchair)?: A Lot Help needed standing up from a chair using your arms (  e.g., wheelchair or bedside chair)?: A Lot Help needed to walk in hospital room?: A Lot Help needed climbing 3-5 steps with a railing? : A Lot 6 Click Score: 15    End of Session Equipment Utilized During Treatment: Gait belt Activity Tolerance: Patient tolerated treatment well;Patient limited by pain Patient left: in chair;with call bell/phone within reach;with family/visitor present;Other (comment) (educated on importance of using  call bell when getting up) Nurse Communication: Mobility status PT Visit Diagnosis: Unsteadiness on feet (R26.81);Other abnormalities of gait and mobility (R26.89);Muscle weakness (generalized) (M62.81);Pain Pain - Right/Left: Right Pain - part of body: Hip (and low back)    Time: [id number] PT Time Calculation (min) (ACUTE ONLY): 24 min   Charges:   PT Evaluation $PT Eval Moderate Complexity: 1 Mod          Raechel ChuteAnna Jerrianne Hartin PT, DPT  Alfonso Pattennna M Brigetta Beckstrom 05/16/2022, 9:17 AM

## 2022-05-16 NOTE — Progress Notes (Signed)
Subjective: Patient is feeling improved in her pain this morning compared to yesterday however continues to have severe pain in her right hip and low back. She was seen by PT and was able to ambulate from her bed to the recliner with assistance. She denies other complaints at this time with no chest pain or shortness of breath.    Objective:  Vital signs in last 24 hours: Vitals:   05/15/22 1919 05/16/22 0405 05/16/22 0743 05/16/22 1407  BP: 135/89 116/63 126/79 (!) 150/95  Pulse: 79 69 71 86  Resp: 18 17 17 16   Temp: 97.7 F (36.5 C) 97.9 F (36.6 C) 97.8 F (36.6 C) 98.3 F (36.8 C)  TempSrc: Oral  Oral Oral  SpO2: 100% 99% 100% 100%  Weight:      Height:       Weight change: -1.589 kg  Intake/Output Summary (Last 24 hours) at 05/16/2022 1530 Last data filed at 05/16/2022 0900 Gross per 24 hour  Intake 492 ml  Output 1200 ml  Net -708 ml    Physical exam  Constitutional:  Alert and oriented x3. In no acute distress.  Respiratory:  No increased work of breathing. In no respiratory distress.  Abdominal:  Non-distended. No organomegaly MSK:  RLE 3/5 strength, LLE 4/5 strength, improved from prior exam. Limited RLE ROM 2/2 pain.  Skin:  No obvious lesion noted Psych:  Normal mood and behavior     Latest Ref Rng & Units 05/16/2022    3:30 AM 05/14/2022    9:10 PM 01/10/2021    4:50 PM  CBC  WBC 4.0 - 10.5 K/uL 8.2   7.5   7.2    Hemoglobin 12.0 - 15.0 g/dL 01/12/2021   18.5   63.1    Hematocrit 36.0 - 46.0 % 35.2   34.5   35.4    Platelets 150 - 400 K/uL 301   303   364         Latest Ref Rng & Units 05/16/2022    3:30 AM 05/15/2022    7:46 PM 05/15/2022    4:32 PM  CMP  Glucose 70 - 99 mg/dL 05/17/2022   90     BUN 8 - 23 mg/dL 10   10     Creatinine 0.44 - 1.00 mg/dL 026   3.78     Sodium 135 - 145 mmol/L 136   140     Potassium 3.5 - 5.1 mmol/L 4.0   3.4     Chloride 98 - 111 mmol/L 106   105     CO2 22 - 32 mmol/L 25   27     Calcium 8.9 - 10.3 mg/dL 9.6   9.6      Total Protein 6.5 - 8.1 g/dL   6.7    Total Bilirubin 0.3 - 1.2 mg/dL   0.5    Alkaline Phos 38 - 126 U/L   82    AST 15 - 41 U/L   15    ALT 0 - 44 U/L   16      Assessment/Plan:  Principal Problem:   Right hip pain Active Problems:   Right leg weakness   Right leg weakness Acute on chronic hip and low back pain History of severe osteoarthritis of the right hip Patient has been started on IV Decadron with some improvement in pain. She continues to have decreased strength in her lower extremities, worse on the right. She was able to ambulate using a  walker and with assistance during PT eval. On exam, strength of the right lower extremity seems to be mildly improved from prior exam. PT/OT eval recommends Home Health PT. Ortho consult does not recommend surgery at this time due to her BMI being over 40.  -Will transition to oral steroids, 4 mg q6h.  -Continue to monitor   Hypokalemia Potassium improved from 2.4 to 4.0 on morning BMP. Currently on Kcl 40 mEq daily. Hypokalemia likely from HCTZ that is currently being held during this admission.  -Will hold K with resolution of hypokalemia  -Continue holding HCTZ -Trend BMP   Hypertension Blood pressure stable at 126/79. Currently on metoprolol succinate 25 mg daily. Currently holding HCTZ due to hypokalemia.  -Continue metoprolol succinate  -Continue to monitor   LOS: 0 days   Ezzard Flax, Medical Student 05/16/2022, 3:30 PM  Pager number: [phone]    Attestation for Student Documentation:  I personally was present and performed or re-performed the history, physical exam and medical decision-making activities of this service and have verified that the service and findings are accurately documented in the student's note.  Evlyn Kanner, MD Internal Medicine Resident PGY-2 Pager: [phone] 05/16/2022, 3:57 PM

## 2022-05-16 NOTE — Progress Notes (Signed)
Overall stable.  Legs feel little better but still with significant right hip and buttock pain.  Able to stand and ambulate a little bit with therapy.  Patient with significant lumbar stenosis however she has severe degenerative disease in her right hip which I think is her primary pain generator at present.  Awaiting orthopedic input.  Continue IV steroids and efforts at mobilization.

## 2022-05-17 DIAGNOSIS — Z87891 Personal history of nicotine dependence: Secondary | ICD-10-CM | POA: Diagnosis not present

## 2022-05-17 DIAGNOSIS — K219 Gastro-esophageal reflux disease without esophagitis: Secondary | ICD-10-CM | POA: Diagnosis present

## 2022-05-17 DIAGNOSIS — Z833 Family history of diabetes mellitus: Secondary | ICD-10-CM | POA: Diagnosis not present

## 2022-05-17 DIAGNOSIS — Z8249 Family history of ischemic heart disease and other diseases of the circulatory system: Secondary | ICD-10-CM | POA: Diagnosis not present

## 2022-05-17 DIAGNOSIS — T502X5A Adverse effect of carbonic-anhydrase inhibitors, benzothiadiazides and other diuretics, initial encounter: Secondary | ICD-10-CM | POA: Diagnosis present

## 2022-05-17 DIAGNOSIS — E876 Hypokalemia: Secondary | ICD-10-CM | POA: Diagnosis present

## 2022-05-17 DIAGNOSIS — M48061 Spinal stenosis, lumbar region without neurogenic claudication: Secondary | ICD-10-CM | POA: Diagnosis present

## 2022-05-17 DIAGNOSIS — M4805 Spinal stenosis, thoracolumbar region: Secondary | ICD-10-CM | POA: Diagnosis present

## 2022-05-17 DIAGNOSIS — Z79899 Other long term (current) drug therapy: Secondary | ICD-10-CM | POA: Diagnosis not present

## 2022-05-17 DIAGNOSIS — G8929 Other chronic pain: Secondary | ICD-10-CM | POA: Diagnosis present

## 2022-05-17 DIAGNOSIS — M1611 Unilateral primary osteoarthritis, right hip: Secondary | ICD-10-CM | POA: Diagnosis present

## 2022-05-17 DIAGNOSIS — I10 Essential (primary) hypertension: Secondary | ICD-10-CM | POA: Diagnosis present

## 2022-05-17 DIAGNOSIS — R7303 Prediabetes: Secondary | ICD-10-CM | POA: Diagnosis present

## 2022-05-17 DIAGNOSIS — M25551 Pain in right hip: Secondary | ICD-10-CM | POA: Diagnosis present

## 2022-05-17 LAB — GLUCOSE, CAPILLARY
Glucose-Capillary: 134 mg/dL — ABNORMAL HIGH (ref 70–99)
Glucose-Capillary: 124 mg/dL — ABNORMAL HIGH (ref 70–99)
Glucose-Capillary: 112 mg/dL — ABNORMAL HIGH (ref 70–99)
Glucose-Capillary: 141 mg/dL — ABNORMAL HIGH (ref 70–99)

## 2022-05-17 NOTE — Progress Notes (Signed)
Subjective:  No acute overnight events.   [name] is a 61 y.o. female with history of severe right hip osteoarthritis who was admitted for acute worsening of her right hip pain with low back pain as well as right leg weakness.   Patient is feeling well this morning. She continues to have pain in her right hip that has improved to a 8/10 from 9/10 yesterday. She was able to move from her bed to the recliner with the help of visitor this morning.    Objective:  Vital signs in last 24 hours: Vitals:   05/16/22 0405 05/16/22 0743 05/16/22 1407 05/17/22 0800  BP: 116/63 126/79 (!) 150/95 (!) 136/91  Pulse: 69 71 86 71  Resp: 17 17 16 18   Temp: 97.9 F (36.6 C) 97.8 F (36.6 C) 98.3 F (36.8 C) 98 F (36.7 C)  TempSrc:  Oral Oral Oral  SpO2: 99% 100% 100% 100%  Weight:      Height:       Weight change:  NA  Intake/Output Summary (Last 24 hours) at 05/17/2022 1112 Last data filed at 05/17/2022 0800 Gross per 24 hour  Intake 1080 ml  Output --  Net 1080 ml    Physical Exam  Constitutional:  Alert and oriented x3. In no acute distress.  Respiratory:  in no respiratory distress. No accessory muscle use.  Abdominal:  No abdominal distension Extremities: No asymmetry noted on lower extremities Skin:  No obvious lesion noted.  Psych:  normal mood and behavior.      Latest Ref Rng & Units 05/16/2022    3:30 AM 05/14/2022    9:10 PM 01/10/2021    4:50 PM  CBC  WBC 4.0 - 10.5 K/uL 8.2   7.5   7.2    Hemoglobin 12.0 - 15.0 g/dL 01/12/2021   34.9   17.9    Hematocrit 36.0 - 46.0 % 35.2   34.5   35.4    Platelets 150 - 400 K/uL 301   303   364       Assessment/Plan:  Principal Problem:   Right hip pain Active Problems:   Right leg weakness  Right leg weakness Acute on chronic hip and low back pain History of severe osteoarthritis of the right hip Patient has been transitioned to oral Decadron. Continues to have improvement in pain. PT has evaluated patient today and she was  unable to climb up one stair. She has at least 4 steps leading up to her home. PT recommends discharging to SNF will be the safest options for her. Spoke with patient about this who was resistant to SNF. She may be safely discharged to home if she progresses tomorrow, per PT recommendation, however SNF will be the safest discharge if she is unable to climb at least one step tomorrow.  Right hip osteoarthritis is end-stage and only definitive treatment is surgery which patient is unfortunately not a candidate for yet with BMI over 40.  -Continue oral Decadron 4 mg q6h for 6 days (last dose 6/6). Patient to follow-up with Dr. 15.0 outpatient in 2 weeks for her spinal stenosis.  -Reassessment with PT on stairs tomorrow.  -Talked with social work about potential need for SNF -Continue naproxen 500 mg TID for pain control  -Continue to monitor   Hypokalemia Hypokalemia has resolved.  -Will hold K with resolution of hypokalemia  -Continue holding HCTZ   Hypertension Blood pressure stable at 136/91. Currently on metoprolol succinate 25 mg daily. Currently holding  HCTZ due to hypokalemia.  -Continue metoprolol succinate  -Continue to monitor   LOS: 0 days   Lisa Macdonald, Medical Student 05/17/2022, 11:12 AM  Pager number: [phone]  Attestation for Student Documentation:  I personally was present and performed or re-performed the history, physical exam and medical decision-making activities of this service and have verified that the service and findings are accurately documented in the student's note.  Lisa Tomer, DO 05/17/2022, 11:23 AM

## 2022-05-17 NOTE — Plan of Care (Signed)

## 2022-05-17 NOTE — Progress Notes (Signed)
   Providing Compassionate, Quality Care - Together   Subjective: Patient reports persistent right hip and buttock pain. She is eager to go home.  Objective: Vital signs in last 24 hours: Temp:  [97.8 F (36.6 C)-98.3 F (36.8 C)] 98.3 F (36.8 C) (06/01 1407) Pulse Rate:  [70-82] 77 (06/01 1407) Resp:  [18] 18 (06/01 1407) BP: (95-166)/(79-91) 133/88 (06/01 1407) SpO2:  [98 %-100 %] 98 % (06/01 1407)  Intake/Output from previous day: 05/31 0701 - 06/01 0700 In: 840 [P.O.:840] Out: -  Intake/Output this shift: Total I/O In: 360 [P.O.:360] Out: -   Alert and oriented x 4 PERRLA CN II-XII grossly intact MAE, Strength and sensation intact 4/5 bilateral knee extension 3/5 right dorsiflexion   Lab Results: Recent Labs    05/14/22 2110 05/16/22 0330  WBC 7.5 8.2  HGB 11.2* 11.3*  HCT 34.5* 35.2*  PLT 303 301   BMET Recent Labs    05/15/22 1946 05/16/22 0330  NA 140 136  K 3.4* 4.0  CL 105 106  CO2 27 25  GLUCOSE 90 122*  BUN 10 10  CREATININE 0.62 0.62  CALCIUM 9.6 9.6    Studies/Results: No results found.  Assessment/Plan: The patient has significant lumbar stenosis that is contributing to her right lower extremity pain.  Her situation is complicated by degenerative joint disease of her right hip.   LOS: 0 days   - Patient can follow-up as an outpatient with Dr. Jordan Likes. -  Continue to work on pain control and mobilize as tolerated. -  It appears the plan is to discharge home tomorrow.   Val Eagle, DNP, AGNP-C Nurse Practitioner  [facility] [address], [address], [place], [place] [address] P: [phone]    F: [phone]  05/17/2022, 5:13 PM

## 2022-05-17 NOTE — Progress Notes (Signed)
Physical Therapy Treatment Patient Details Name: Lisa Macdonald MRN: [id number] DOB: Jul 03, 1961 Today's Date: 05/17/2022   History of Present Illness Pt is a 61 y/o F presenting with R hip and low back pain - MRI revealed severe stenosis of the spinal canal and degenerative changes at R hip. PMH includes OA of R hip, hypertension, and pre-diabetes.    PT Comments    Pt received OOB in chair on arrival eager and motivated for participation in session focused on stair training for safe d/c to home. Pt unable to ascend steps in gym, trialed R and L foot first, forward and sideways , pt unable with max assist to power up. Pt also unable to advance foot up onto step with this PTA needing to pick up and place foot on step. Pt able to demonstrate gait progression this session with min guard assist for safety and some cues to keep RW on floor at all times. Pt may need SNF if unable to progress stair training as pt with 4 steps to enter home. Pt continues to benefit from skilled PT services to progress toward functional mobility goals.    Recommendations for follow up therapy are one component of a multi-disciplinary discharge planning process, led by the attending physician.  Recommendations may be updated based on patient status, additional functional criteria and insurance authorization.  Follow Up Recommendations  Home health PT (may need SNF if unable to progress stair training for safe entry into home)     Assistance Recommended at Discharge Frequent or constant Supervision/Assistance  Patient can return home with the following A lot of help with walking and/or transfers;A lot of help with bathing/dressing/bathroom;Direct supervision/assist for medications management;Direct supervision/assist for financial management;Assist for transportation;Help with stairs or ramp for entrance   Equipment Recommendations  Rolling walker (2 wheels)    Recommendations for Other Services       Precautions /  Restrictions Precautions Precautions: Fall Restrictions Weight Bearing Restrictions: No     Mobility  Bed Mobility Overal bed mobility: Modified Independent             General bed mobility comments: pt OOB in recliner on arrival    Transfers Overall transfer level: Needs assistance Equipment used: Rolling walker (2 wheels) Transfers: Sit to/from Stand Sit to Stand: Min assist           General transfer comment: min a to come to standing to steady on rise. repetetive cues for hand placement throughout    Ambulation/Gait Ambulation/Gait assistance: Min guard Gait Distance (Feet): 75 Feet Assistive device: Rolling walker (2 wheels) Gait Pattern/deviations: Decreased step length - right, Decreased step length - left, Decreased stance time - right, Decreased stride length, Decreased weight shift to right, Antalgic, Narrow base of support Gait velocity: decreased     General Gait Details: cues for keeping RW on ground. min guard for safety   Stairs Stairs: Yes   Stair Management: One rail Right, Two rails, Sideways, Forwards Number of Stairs: 0 General stair comments: pt unable to ascend steps in gym, trialed R and L foot first, forward and sideways , pt unable with max assist to power up. pt also unable to advance foot up onto step with this PTA needing to pick up and place foot on step   Wheelchair Mobility    Modified Rankin (Stroke Patients Only)       Balance Overall balance assessment: Needs assistance Sitting-balance support: Bilateral upper extremity supported, Feet supported Sitting balance-Leahy Scale: Fair Sitting balance -  Comments: sitting EOB with distant supervision   Standing balance support: Bilateral upper extremity supported, During functional activity (RW) Standing balance-Leahy Scale: Poor                              Cognition Arousal/Alertness: Awake/alert Behavior During Therapy: WFL for tasks  assessed/performed Overall Cognitive Status: Within Functional Limits for tasks assessed                                 General Comments: hyperverbal and requires repetition and cues for problem solving        Exercises      General Comments General comments (skin integrity, edema, etc.): family present      Pertinent Vitals/Pain Pain Assessment Pain Assessment: Faces Faces Pain Scale: Hurts a little bit Pain Location: R leg and hip Pain Descriptors / Indicators: Discomfort, Grimacing, Guarding, Aching Pain Intervention(s): Limited activity within patient's tolerance, Monitored during session    Home Living                          Prior Function            PT Goals (current goals can now be found in the care plan section) Acute Rehab PT Goals Patient Stated Goal: to return home PT Goal Formulation: With patient Time For Goal Achievement: 05/23/22    Frequency    Min 3X/week      PT Plan      Co-evaluation              AM-PAC PT "6 Clicks" Mobility   Outcome Measure  Help needed turning from your back to your side while in a flat bed without using bedrails?: None Help needed moving from lying on your back to sitting on the side of a flat bed without using bedrails?: A Little Help needed moving to and from a bed to a chair (including a wheelchair)?: A Lot Help needed standing up from a chair using your arms (e.g., wheelchair or bedside chair)?: A Lot Help needed to walk in hospital room?: A Lot Help needed climbing 3-5 steps with a railing? : Total 6 Click Score: 14    End of Session Equipment Utilized During Treatment: Gait belt Activity Tolerance: Patient tolerated treatment well;Patient limited by pain Patient left: in chair;with call bell/phone within reach;with family/visitor present;Other (comment) (educated on importance of using call bell when getting up) Nurse Communication: Mobility status;Other (comment) (unable  to ascend steps) PT Visit Diagnosis: Unsteadiness on feet (R26.81);Other abnormalities of gait and mobility (R26.89);Muscle weakness (generalized) (M62.81);Pain Pain - Right/Left: Right Pain - part of body: Hip (and low back)     Time: 8657-8469 PT Time Calculation (min) (ACUTE ONLY): 24 min  Charges:  $Gait Training: 23-37 mins                     Brooks Kinnan R. PTA Acute Rehabilitation Services Office: [phone]    Catalina Antigua 05/17/2022, 10:45 AM

## 2022-05-18 LAB — GLUCOSE, CAPILLARY
Glucose-Capillary: 105 mg/dL — ABNORMAL HIGH (ref 70–99)
Glucose-Capillary: 114 mg/dL — ABNORMAL HIGH (ref 70–99)

## 2022-05-18 MED ORDER — DEXAMETHASONE 4 MG PO TABS: 4.0000 mg | ORAL_TABLET | Freq: Four times a day (QID) | ORAL | 0 refills | Status: AC

## 2022-05-18 NOTE — Progress Notes (Signed)
Physical Therapy Treatment Patient Details Name: Lisa Macdonald MRN: [id number] DOB: Nov 12, 1961 Today's Date: 05/18/2022   History of Present Illness Pt is a 61 y/o F presenting with R hip and low back pain - MRI revealed severe stenosis of the spinal canal and degenerative changes at R hip. PMH includes OA of R hip, hypertension, and pre-diabetes.    PT Comments    Pt received OOB in recliner on arrival eager and motivated for session with continued focus on stair training for safe d/c to home. Pt able to ascend/descend step x2 with success this session with min guard for safety and light cues for sequencing with good carryover. Pt with increased stability this session requiring min guard to come to standing from recliner and increased ambulation distance tolerated with good carryover for safety with RW. Educated pt re; importance of continued mobility, activity recommendations, safe car entry/exit and provided pt with gait belt for home use. Anticipate safe discharge with family assistance once medically cleared, will follow acutely. Pt continues to benefit from skilled PT services to progress toward functional mobility goals.     Recommendations for follow up therapy are one component of a multi-disciplinary discharge planning process, led by the attending physician.  Recommendations may be updated based on patient status, additional functional criteria and insurance authorization.  Follow Up Recommendations  Home health PT (may need SNF if unable to progress stair training for safe entry into home)     Assistance Recommended at Discharge Frequent or constant Supervision/Assistance  Patient can return home with the following A lot of help with walking and/or transfers;A lot of help with bathing/dressing/bathroom;Direct supervision/assist for medications management;Direct supervision/assist for financial management;Assist for transportation;Help with stairs or ramp for entrance   Equipment  Recommendations  Rolling walker (2 wheels)    Recommendations for Other Services       Precautions / Restrictions Precautions Precautions: Fall Restrictions Weight Bearing Restrictions: No     Mobility  Bed Mobility Overal bed mobility: Modified Independent             General bed mobility comments: pt OOB in recliner on arrival    Transfers Overall transfer level: Needs assistance Equipment used: Rolling walker (2 wheels) Transfers: Sit to/from Stand Sit to Stand: Min guard           General transfer comment: good recall of hand placement for ascent, min guard for safety    Ambulation/Gait Ambulation/Gait assistance: Min guard Gait Distance (Feet): 100 Feet Assistive device: Rolling walker (2 wheels) Gait Pattern/deviations: Decreased step length - right, Decreased step length - left, Decreased stance time - right, Decreased stride length, Decreased weight shift to right, Antalgic, Narrow base of support Gait velocity: decreased     General Gait Details: min guard for safety   Stairs Stairs: Yes Stairs assistance: Min guard Stair Management: No rails, Step to pattern, Forwards, With walker Number of Stairs: 2 General stair comments: pt able to ascend/descend stepx2 with walker forwards with no LOB and improved ability to lift foot and place on step without assist   Wheelchair Mobility    Modified Rankin (Stroke Patients Only)       Balance Overall balance assessment: Needs assistance Sitting-balance support: Bilateral upper extremity supported, Feet supported Sitting balance-Leahy Scale: Fair Sitting balance - Comments: sitting EOB with distant supervision   Standing balance support: Bilateral upper extremity supported, During functional activity (RW) Standing balance-Leahy Scale: Poor  Cognition Arousal/Alertness: Awake/alert Behavior During Therapy: WFL for tasks assessed/performed Overall  Cognitive Status: Within Functional Limits for tasks assessed                                 General Comments: hyperverbal and requires repetition and cues for problem solving        Exercises      General Comments General comments (skin integrity, edema, etc.): family present throughout      Pertinent Vitals/Pain Pain Assessment Pain Assessment: Faces Faces Pain Scale: Hurts a little bit Pain Location: R leg and hip Pain Descriptors / Indicators: Discomfort, Grimacing, Guarding, Aching Pain Intervention(s): Monitored during session, Limited activity within patient's tolerance    Home Living                          Prior Function            PT Goals (current goals can now be found in the care plan section) Acute Rehab PT Goals Patient Stated Goal: to return home PT Goal Formulation: With patient Time For Goal Achievement: 05/23/22    Frequency    Min 3X/week      PT Plan      Co-evaluation              AM-PAC PT "6 Clicks" Mobility   Outcome Measure  Help needed turning from your back to your side while in a flat bed without using bedrails?: None Help needed moving from lying on your back to sitting on the side of a flat bed without using bedrails?: A Little Help needed moving to and from a bed to a chair (including a wheelchair)?: A Lot Help needed standing up from a chair using your arms (e.g., wheelchair or bedside chair)?: A Little Help needed to walk in hospital room?: A Little Help needed climbing 3-5 steps with a railing? : A Lot 6 Click Score: 17    End of Session Equipment Utilized During Treatment: Gait belt Activity Tolerance: Patient tolerated treatment well Patient left: in chair;with call bell/phone within reach;with family/visitor present;Other (comment) (with OT present) Nurse Communication: Mobility status;Other (comment) (unable to ascend steps) PT Visit Diagnosis: Unsteadiness on feet (R26.81);Other  abnormalities of gait and mobility (R26.89);Muscle weakness (generalized) (M62.81);Pain Pain - Right/Left: Right Pain - part of body: Hip (and low back)     Time: 7782-4235 PT Time Calculation (min) (ACUTE ONLY): 20 min  Charges:  $Gait Training: 8-22 mins                     Eniya Cannady R. PTA Acute Rehabilitation Services Office: [phone]   Catalina Antigua 05/18/2022, 10:37 AM

## 2022-05-18 NOTE — Progress Notes (Signed)
Pt discharged home in stable condition. Discharge instructions given. Pt verbalized understanding. No immediate questions or concerns. DC'd from unit via wheelchair

## 2022-05-18 NOTE — Plan of Care (Signed)
  Problem: Education: Goal: Knowledge of General Education information will improve Description: Including pain rating scale, medication(s)/side effects and non-pharmacologic comfort measures Outcome: Adequate for Discharge   Problem: Health Behavior/Discharge Planning: Goal: Ability to manage health-related needs will improve Outcome: Adequate for Discharge   Problem: Clinical Measurements: Goal: Ability to maintain clinical measurements within normal limits will improve Outcome: Adequate for Discharge Goal: Will remain free from infection Outcome: Adequate for Discharge Goal: Diagnostic test results will improve Outcome: Adequate for Discharge Goal: Respiratory complications will improve Outcome: Adequate for Discharge Goal: Cardiovascular complication will be avoided Outcome: Adequate for Discharge   Problem: Activity: Goal: Risk for activity intolerance will decrease Outcome: Adequate for Discharge   Problem: Nutrition: Goal: Adequate nutrition will be maintained Outcome: Adequate for Discharge   Problem: Coping: Goal: Level of anxiety will decrease Outcome: Adequate for Discharge   Problem: Elimination: Goal: Will not experience complications related to bowel motility Outcome: Adequate for Discharge Goal: Will not experience complications related to urinary retention Outcome: Adequate for Discharge   Problem: Pain Managment: Goal: General experience of comfort will improve Outcome: Adequate for Discharge   Problem: Safety: Goal: Ability to remain free from injury will improve Outcome: Adequate for Discharge   Problem: Skin Integrity: Goal: Risk for impaired skin integrity will decrease Outcome: Adequate for Discharge   Problem: Acute Rehab PT Goals(only PT should resolve) Goal: Pt will Roll Supine to Side Outcome: Adequate for Discharge Goal: Pt Will Go Supine/Side To Sit Outcome: Adequate for Discharge Goal: Pt Will Go Sit To Supine/Side Outcome:  Adequate for Discharge Goal: Patient Will Transfer Sit To/From Stand Outcome: Adequate for Discharge Goal: Pt Will Transfer Bed To Chair/Chair To Bed Outcome: Adequate for Discharge Goal: Pt Will Ambulate Outcome: Adequate for Discharge   Problem: Acute Rehab PT Goals(only PT should resolve) Goal: Pt Will Go Up/Down Stairs Outcome: Adequate for Discharge   Problem: Acute Rehab OT Goals (only OT should resolve) Goal: Pt. Will Perform Lower Body Bathing Outcome: Adequate for Discharge Goal: Pt. Will Perform Lower Body Dressing Outcome: Adequate for Discharge Goal: Pt. Will Transfer To Toilet Outcome: Adequate for Discharge Goal: Pt. Will Perform Toileting-Clothing Manipulation Outcome: Adequate for Discharge Goal: OT Additional ADL Goal #1 Outcome: Adequate for Discharge

## 2022-05-18 NOTE — TOC Initial Note (Signed)
Transition of Care [facility]) - Initial/Assessment Note    Patient Details  Name: Lisa Macdonald MRN: [id number] Date of Birth: 10-20-1961  Transition of Care [facility]) CM/SW Contact:    Sharin Mons, RN Phone Number: 05/18/2022, 6:34 AM  Clinical Narrative:                 Presents with R hip pain, lumbar stenosis/ degenerative joint disease of her right hip. From  home with family. Agreeable to home health services per PT's recommendations. Referral made with [facility] and accepted. DME orders noted and referral made with [employer] for RW, bsc. Equipment will be delivered to bedside prior to d/c.  Pt without transportation issues once d/c ready.  TOC team following for needs....  Expected Discharge Plan: [place] Barriers to Discharge: Continued Medical Work up   Patient Goals and CMS Choice     Choice offered to / list presented to : Patient  Expected Discharge Plan and Services Expected Discharge Plan: [place]   Discharge Planning Services: CM Consult                     DME Arranged: Gilford Rile rolling, 3-N-1 DME Agency: [employer] Date DME Agency Contacted: 05/16/22 Time DME Agency Contacted: 1700 Representative spoke with at DME Agency: [place]: [place]: [place] Date [place]: 05/16/22 Time [place]: 1659 Representative spoke with at [place]: Tommi Rumps  Prior Living Arrangements/Services                       Activities of [place] Devices/Equipment: Environmental consultant (specify type), Cane (specify quad or straight) ADL Screening (condition at time of admission) Patient's cognitive ability adequate to safely complete daily activities?: Yes Is the patient deaf or have difficulty hearing?: No Does the patient have difficulty seeing, even when wearing glasses/contacts?: No Does the patient have difficulty concentrating, remembering, or making  decisions?: No Patient able to express need for assistance with ADLs?: Yes Does the patient have difficulty dressing or bathing?: No Independently performs ADLs?: Yes (appropriate for developmental age) Does the patient have difficulty walking or climbing stairs?: Yes Weakness of Legs: Both Weakness of Arms/Hands: None  Permission Sought/Granted                  Emotional Assessment              Admission diagnosis:  Right hip pain [M25.551] Right leg weakness [R29.898] Essential hypertension [I10] Unilateral primary osteoarthritis, right hip [M16.11] Acute midline low back pain, unspecified whether sciatica present [M54.50] Patient Active Problem List   Diagnosis Date Noted   Right leg weakness    Right hip pain 05/15/2022   Screening cholesterol level 01/12/2021   Essential hypertension 01/10/2021   Iron deficiency anemia 01/10/2021   Polyuria 01/10/2021   Unilateral primary osteoarthritis, right hip 09/11/2017   Pain in right hip 09/11/2017   PCP:  Nolene Ebbs, MD Pharmacy:   [facility] [address], [place] [address] Phone: [phone] Fax: [place], [place] - [address] [place] [facility] [place] [address] Phone: [phone] Fax: [phone]     Social Determinants of Health (SDOH) Interventions    Readmission Risk Interventions     View : No data to display.

## 2022-05-18 NOTE — Progress Notes (Signed)
Occupational Therapy Treatment Patient Details Name: Lisa Macdonald MRN: [id number] DOB: [birth year] Today's Date: 05/18/2022   History of present illness Pt is a 61 y/o F presenting with R hip and low back pain - MRI revealed severe stenosis of the spinal canal and degenerative changes at R hip. PMH includes OA of R hip, hypertension, and pre-diabetes.   OT comments  Pt. Seen for skilled OT session. Provided A/E for pt. And demonstrated use.  Pt. Able to return demo for use of LB B/D to aide in increased independence while managing pain in lower back and hip.  Also reviewed use and set up of 3n1 for various uses at home.  Cousin present and active in education also.     Recommendations for follow up therapy are one component of a multi-disciplinary discharge planning process, led by the attending physician.  Recommendations may be updated based on patient status, additional functional criteria and insurance authorization.    Follow Up Recommendations  Home health OT    Assistance Recommended at Discharge Frequent or constant Supervision/Assistance  Patient can return home with the following  A lot of help with walking and/or transfers;A lot of help with bathing/dressing/bathroom;Assistance with cooking/housework;Assist for transportation;Help with stairs or ramp for entrance   Equipment Recommendations  Other (comment)    Recommendations for Other Services      Precautions / Restrictions Precautions Precautions: Fall Restrictions Weight Bearing Restrictions: No       Mobility Bed Mobility               General bed mobility comments: pt OOB in recliner on arrival    Transfers Overall transfer level: Needs assistance Equipment used: Rolling walker (2 wheels) Transfers: Sit to/from Stand, Bed to chair/wheelchair/BSC Sit to Stand: Min guard           General transfer comment: good recall of hand placement for ascent, min guard for safety with initial cues and demo  provided     Balance                                           ADL either performed or assessed with clinical judgement   ADL Overall ADL's : Needs assistance/impaired             Lower Body Bathing: Set up;Cueing for sequencing;With adaptive equipment;Sitting/lateral leans Lower Body Bathing Details (indicate cue type and reason): able to return demo of use of LH sponge     Lower Body Dressing: Set up;With adaptive equipment;Cueing for sequencing;Sitting/lateral leans Lower Body Dressing Details (indicate cue type and reason): able to return demo of use of reacher and sock aide Toilet Transfer: Supervision/safety;Rolling walker (2 wheels);BSC/3in1 Toilet Transfer Details (indicate cue type and reason): amb. to b.room with cousin for 3n1 education-demonstrated uses for all 3         Functional mobility during ADLs: Supervision/safety;Rolling walker (2 wheels) General ADL Comments: able to return demo of a/e    Extremity/Trunk Assessment              Vision       Perception     Praxis      Cognition Arousal/Alertness: Awake/alert Behavior During Therapy: WFL for tasks assessed/performed Overall Cognitive Status: Within Functional Limits for tasks assessed  General Comments: hyperverbal and requires repetition and cues for problem solving        Exercises      Shoulder Instructions       General Comments family present throughout    Pertinent Vitals/ Pain       Pain Assessment Pain Assessment: No/denies pain  Home Living                                          Prior Functioning/Environment              Frequency  Min 2X/week        Progress Toward Goals  OT Goals(current goals can now be found in the care plan section)  Progress towards OT goals: Progressing toward goals     Plan Discharge plan remains appropriate    Co-evaluation                  AM-PAC OT "6 Clicks" Daily Activity     Outcome Measure   Help from another person eating meals?: A Little Help from another person taking care of personal grooming?: A Little Help from another person toileting, which includes using toliet, bedpan, or urinal?: A Lot Help from another person bathing (including washing, rinsing, drying)?: A Lot Help from another person to put on and taking off regular upper body clothing?: A Little Help from another person to put on and taking off regular lower body clothing?: A Lot 6 Click Score: 15    End of Session Equipment Utilized During Treatment: Rolling walker (2 wheels)  OT Visit Diagnosis: Unsteadiness on feet (R26.81);Other abnormalities of gait and mobility (R26.89);Muscle weakness (generalized) (M62.81);Pain Pain - Right/Left: Right Pain - part of body: Hip   Activity Tolerance Patient tolerated treatment well   Patient Left in chair;with call bell/phone within reach   Nurse Communication          Time: 1030-1050 OT Time Calculation (min): 20 min  Charges: OT General Charges $OT Visit: 1 Visit OT Treatments $Self Care/Home Management : 8-22 mins  Boneta Lucks, COTA/L Acute Rehabilitation [phone]   Salvadore Oxford 05/18/2022, 12:54 PM

## 2022-05-18 NOTE — Plan of Care (Signed)
  Problem: Activity: Goal: Risk for activity intolerance will decrease Outcome: Progressing   Problem: Nutrition: Goal: Adequate nutrition will be maintained Outcome: Progressing   Problem: Coping: Goal: Level of anxiety will decrease Outcome: Progressing   

## 2022-05-18 NOTE — TOC Transition Note (Incomplete)
Transition of Care [facility]) - CM/SW Discharge Note   Patient Details  Name: Lisa Macdonald MRN: [id number] Date of Birth: January 19, 1961  Transition of Care [facility]) CM/SW Contact:  Epifanio Lesches, RN Phone Number: 05/18/2022, 10:41 AM   Clinical Narrative:    Patient will DC to: home Anticipated DC date: 05/18/2022 Family notified: yes Transport by: car   Per MD patient ready for DC today . RN, patient, patient's family, and [facility] notified of DC.    RNCM will sign off for now as intervention is no longer needed. Please consult [name] again if new needs arise.    Final next level of care: Home w Home Health Services Barriers to Discharge: No Barriers Identified   Patient Goals and CMS Choice     Choice offered to / list presented to : Patient  Discharge Placement                       Discharge Plan and Services   Discharge Planning Services: CM Consult            DME Arranged: Dan Humphreys rolling, 3-N-1 DME Agency: [facility] Date DME Agency Contacted: 05/16/22 Time DME Agency Contacted: 1700 Representative spoke with at DME Agency: Jeraldine Loots HH Arranged: PT HH Agency: [facility] Date [facility] Agency Contacted: 05/16/22 Time HH Agency Contacted: 1659 Representative spoke with at [facility] Agency: Kandee Keen  Social Determinants of Health (SDOH) Interventions     Readmission Risk Interventions     View : No data to display.

## 2022-10-29 ENCOUNTER — Other Ambulatory Visit: Payer: Self-pay | Admitting: Internal Medicine

## 2022-10-30 LAB — COMPLETE METABOLIC PANEL WITH GFR
AG Ratio: 1.1 (calc) (ref 1.0–2.5)
ALT: 24 U/L (ref 6–29)
AST: 26 U/L (ref 10–35)
Albumin: 3.8 g/dL (ref 3.6–5.1)
Alkaline phosphatase (APISO): 143 U/L (ref 37–153)
BUN: 16 mg/dL (ref 7–25)
CO2: 27 mmol/L (ref 20–32)
Calcium: 9.5 mg/dL (ref 8.6–10.4)
Chloride: 102 mmol/L (ref 98–110)
Creat: 0.66 mg/dL (ref 0.50–1.05)
Globulin: 3.4 g/dL (calc) (ref 1.9–3.7)
Glucose, Bld: 94 mg/dL (ref 65–99)
Potassium: 4 mmol/L (ref 3.5–5.3)
Sodium: 139 mmol/L (ref 135–146)
Total Bilirubin: 0.3 mg/dL (ref 0.2–1.2)
Total Protein: 7.2 g/dL (ref 6.1–8.1)
eGFR: 100 mL/min/{1.73_m2} (ref >=60)

## 2022-10-30 LAB — CBC
HCT: 32.8 % — ABNORMAL LOW (ref 35.0–45.0)
Hemoglobin: 10.9 g/dL — ABNORMAL LOW (ref 11.7–15.5)
MCH: 26.3 pg — ABNORMAL LOW (ref 27.0–33.0)
MCHC: 33.2 g/dL (ref 32.0–36.0)
MCV: 79 fL — ABNORMAL LOW (ref 80.0–100.0)
MPV: 9.5 fL (ref 7.5–12.5)
Platelets: 331 10*3/uL (ref 140–400)
RBC: 4.15 10*6/uL (ref 3.80–5.10)
RDW: 13 % (ref 11.0–15.0)
WBC: 8 10*3/uL (ref 3.8–10.8)

## 2022-10-30 LAB — LIPID PANEL
Cholesterol: 182 mg/dL (ref <200)
HDL: 53 mg/dL (ref >=50)
LDL Cholesterol (Calc): 114 mg/dL (calc) — ABNORMAL HIGH
Non-HDL Cholesterol (Calc): 129 mg/dL (calc) (ref <130)
Total CHOL/HDL Ratio: 3.4 (calc) (ref <5.0)
Triglycerides: 67 mg/dL (ref <150)

## 2022-10-30 LAB — VITAMIN D 25 HYDROXY (VIT D DEFICIENCY, FRACTURES): Vit D, 25-Hydroxy: 41 ng/mL (ref 30–100)

## 2022-10-30 LAB — TSH: TSH: 3.49 mIU/L (ref 0.40–4.50)

## 2023-04-09 ENCOUNTER — Telehealth: Payer: Self-pay | Admitting: Orthopaedic Surgery

## 2023-04-09 ENCOUNTER — Ambulatory Visit: Payer: Medicaid Other | Attending: Internal Medicine | Admitting: Physical Therapy

## 2023-04-09 DIAGNOSIS — M25551 Pain in right hip: Secondary | ICD-10-CM

## 2023-04-09 DIAGNOSIS — M545 Low back pain, unspecified: Secondary | ICD-10-CM

## 2023-04-09 DIAGNOSIS — R2681 Unsteadiness on feet: Secondary | ICD-10-CM

## 2023-04-09 DIAGNOSIS — R269 Unspecified abnormalities of gait and mobility: Secondary | ICD-10-CM | POA: Diagnosis not present

## 2023-04-09 DIAGNOSIS — M25512 Pain in left shoulder: Secondary | ICD-10-CM

## 2023-04-09 NOTE — Therapy (Unsigned)
OUTPATIENT PHYSICAL THERAPY WHEELCHAIR EVALUATION   Patient Name: Lisa Macdonald MRN: [id number] DOB:October 28, 1961, 62 y.o., female Today's Date: 04/09/2023  END OF SESSION:  PT End of Session - 04/09/23 1235     Visit Number 1    Number of Visits 1    Authorization Type [place] Medicaid    PT Start Time 1240    PT Stop Time 1330    PT Time Calculation (min) 50 min    Equipment Utilized During Treatment Gait belt    Activity Tolerance Patient tolerated treatment well    Behavior During Therapy WFL for tasks assessed/performed             Past Medical History:  Diagnosis Date   Arthritis    GERD (gastroesophageal reflux disease)    Hypertension    No past surgical history on file. Patient Active Problem List   Diagnosis Date Noted   Right leg weakness    Right hip pain 05/15/2022   Screening cholesterol level 01/12/2021   Essential hypertension 01/10/2021   Iron deficiency anemia 01/10/2021   Polyuria 01/10/2021   Unilateral primary osteoarthritis, right hip 09/11/2017   Pain in right hip 09/11/2017    PCP: Fleet Contras, MD  REFERRING PROVIDER: Fleet Contras, MD  THERAPY DIAG:  Abnormality of gait and mobility  Unsteadiness on feet  Lumbar pain  Pain in right hip  Left shoulder pain, unspecified chronicity  Rationale for Evaluation and Treatment Rehabilitation  SUBJECTIVE:                                                                                                                                                                                           SUBJECTIVE STATEMENT: Pt presents for wheelchair evaluation. Patient reports significant PMH of spinal stenosis, pre-diabetes and morbid obesity, right hip osteoarthritis. Patient has previously been using a rollator over the last 3 years to get around. Patient reports that last memorial day, she had to call paramedics to come get her up because she wasn't able get up without help. Patient reports that  she is not able to get up from low surfaces without significant assistance still and so has to modify where she sits. Patient reports that she is currently not working due to the significance of her pain in right hip, low back, and left shoulder. Patient has very limited standing tolerance and is unable to stand long enough to cook at her stove. Patient reports 2-3 near falls in the last 6 months.   PRECAUTIONS: Fall  WEIGHT BEARING RESTRICTIONS No   OCCUPATION: Unemployed   PLOF:  Fully independent and working, ambulating without AD  3 years ago. Required progressively more assistance from daughters over the last 3 years.   PATIENT GOALS: "Be able to get room to room easier."     MEDICAL HISTORY:  Primary diagnosis onset: 02/03/2023 (referral date) Diagnosis  Code:  Diagnosis:    Diagnosis code: M48.00      Diagnosis: Spinal stenosis  Diagnosis  Code:  Diagnosis:   [] Progressive disease  Relevant future surgeries:     Height: 5'5' Weight: 259 lbs Explain recent changes or trends in weight:      History:  Past Medical History:  Diagnosis Date   Arthritis    GERD (gastroesophageal reflux disease)    Hypertension             Cardio Status:  Functional Limitations:   [x] Intact  []  Impaired      Respiratory Status:  Functional Limitations:   [x] Intact  [] Impaired   [] SOB [] COPD [] O2 Dependent ______LPM  [] Ventilator Dependent  Resp equip:                                                     Objective Measure(s):   Orthotics:   [] Amputee:                                                             [] Prosthesis:       HOME ENVIRONMENT:  [x] House [] Condo/town home [] Apartment [] Asst living [] LTCF         [x] Own  [] Rent with ramp built in back of house, house has carpet as primary flooring, house is one level  [] Lives alone [x] Lives with others -   2 daughters            Hours without assistance: none - always someone present  [x] Home is accessible to patient                                 Storage of wheelchair:  [x] In home   [] Other Comments:        COMMUNITY :  TRANSPORTATION:  [x] Car [] Van [] Public Transportation [] Adapted w/c Lift []  Ambulance [] Other: 2005 Honda Accord                   [] Sits in wheelchair during transport   Where is w/c stored during transport? Trunk of car [x] [place] [place]  []  EZ Lock  r   [x] Self-Driver       Drive while in  Biomedical scientist [] yes [] no   Employment and/or school: Currently unemployed Specific requirements pertaining to mobility: Patient is hoping to have a means to be able to go to [employer] and grocery shopping. Patient also needs a means to safely ambulate room to room. Patient has ramp that makes her home accessible; however, currently requires min-mod assistance from family members to control her rollator down the ramp due to decreased strength and stability.    Other:  COMMUNICATION:  Verbal Communication  [x] WFL [] receptive [] WFL [] expressive [] Understandable  [] Difficult to understand  [] non-communicative  Primary Language:_English_____________ 2nd:_____________  Communication provided by:[x] Patient [] Family [] Caregiver [] Translator   [] Uses an Paramedic  device     Manufacturer/Model :     MOBILITY/BALANCE:  Sitting Balance  Standing Balance  Transfers  Ambulation   [x] WFL      [] WFL  [] Independent  []  Independent   [] Uses UE for balance in sitting Comments:  [x] Uses UE/device for stability Comments: Needs modA to come to stand from low surfaces when arm rests are not present [x]  Min assist - with use of rollator with need for setup assistance as needed []  Ambulates independently with       device:___________________      []  Mod assist  []  Able to ambulate ______ feet        safely/functionally/independently   []  Min assist  []  Min assist  []  Max assist  [x]  Non-functional ambulator         History/High risk of falls - multiple near falls over the last 6 months  []  Mod assist  []  Mod assist  []   Dependent  []  Unable to ambulate   []  Max  assist  []  Max assist  Transfer method:[x] 1 person [] 2 person [] sliding board [] squat pivot [x] stand pivot [] mechanical patient lift  [x] other: able to walk short distance with rollator when someone is present for safety; however, unstable and places at increased risk for falls  []  Unable  []  Unable    Fall History: # of falls in the past 6 months? 0 # of "near" falls in the past 6 months? 3 near falls    CURRENT SEATING / MOBILITY:  Current Mobility Device: [] None [x] Cane/Walker - rollator [] Manual [] Dependent [] Dependent w/ Tilt Scooter  [] Power (type of control):   Manufacturer: Drive Model: Unknown Serial #:   Size:  Color:  Age:   Purchased by whom: Patient believes insurance covered but does not recall specifics   Current condition of mobility base:  locks do not work  Current seating system:                                                                     Age of seating system:  58-17 years old (patient estimate but is unsure)  Describe posture in present seating system:    Is the current mobility meeting medical necessity?:  [] Yes [x] No Describe: Patient unable to manage down a ramp by herself to access home independently, increased risk for falls when ambulating, breaks are currently broke and not providing adequate stability                             Ability to complete Mobility-Related Activities of Daily Living (MRADL's) with Current Mobility Device:   Move room to room  [] Independent  [x] Min [] Mod [] Max assist  [] Unable  Comments: Requires rollator to go room to room but has had near falls, needs assistance from daughter to change bedside commode and to manage right lower leg into shower. Patient requires modA to come to stand when sitting on too low of a surfaces so tends to plan accordingly.   Meal prep  [] Independent  [x] Min [] Mod [] Max assist  [] Unable    Feeding  [x] Independent  [] Min [] Mod [] Max assist  [] Unable    Bathing   [] Independent  [x] Min [] Mod [] Max assist  [] Unable  Grooming  Independent  Min Mod Max assist  Unable    UE dressing  Independent  Min Mod Max assist  Unable    LE dressing  Independent   Min Mod Max assist  Unable    Toileting  Independent  Min Mod Max assist  Unable    Bowel Mgt:  Continent  Incontinent  Accidents  Diapers  Colostomy  Bowel Program:  Bladder Mgt:  Continent  Incontinent  Accidents  Diapers  Urinal  Intermittent Cath  Indwelling Cath  Supra-pubic Cath     Current Mobility Equipment Trialed/ Ruled Out:    Does not meet mobility needs due to:    Mark all boxes that indicate inability to use the specific equipment listed     Meets needs for safe  independent functional  ambulation  / mobility    Risk of  Falling or History of Falls    Enviromental limitations      Cognition    Safety concerns with  physical ability    Decreased / limitations endurance  & strength     Decreased / limitations  motor skills  & coordination    Pain    Pace /  Speed    Cardiac and/or  respiratory condition    Contra - indicated by diagnosis   Cane/Crutches                         Walker / Rollator   NA                           Manual Wheelchair [id number]:   NA                         Manual W/C (K0005) with power assist   NA                         Scooter   NA                         Power Wheelchair: standard joystick   NA                         Power Wheelchair: alternative controls   NA                         Summary:  The least costly alternative for independent functional mobility was found to be:     Crutch/Cane   Walker  Manual w/c   Manual w/c with power assist    Scooter    Power w/c  std joystick    Power w/c alternative control         Requires dependent care mobility device   Cabin crew for [employer] skills are adequate for safe mobility equipment operation    Yes   No  Patient is willing and motivated to use recommended mobility equipment    Yes   No        Patient is unable to safely operate mobility equipment independently and requires dependent care equipment Comments:           SENSATION and SKIN ISSUES:  Sensation  Intact   Impaired  Absent  Hyposensate  Hypersensate   Defensiveness  Location(s) of impairment: R  side impairment due to radicular symptoms radiating from lumbar region along right leg from low back into ankle   Pressure Relief Method(s):  [x]  Lean side to side to offload (without risk of falling)  []   W/C push up (4+ times/hour for 15+ seconds) []  Stand up (without risk of falling)    []  Other: (Describe): Effective pressure relief method(s) above can be performed consistently throughout the day: rYes  r No If not, Why?:  Skin Integrity Risk:       []  Low risk           [x]  Moderate risk            []  High risk  Patient is mod risk given impaired sensitivity on right lower extremity but is able to offload with tilting side to side effectively.   Skin Issues/Skin Integrity  Current skin Issues  []  Yes [x]  No [x]  Intact  []   Red area   []   Open area  []  Scar tissue  []  At risk from prolonged sitting  Where: History of Skin Issues  []  Yes [x]  No Where : When: Stage: Hx of skin flap surgeries  []  Yes [x]  No Where:  When:  Pain: [x]  Yes []  No   Pain Location(s): low back, left shoulder, right hip Intensity scale: (0-10) : 8/10 How does pain interfere with mobility and/or MRADLs? - pain has been so bad that patient has been unable to stand by herself, only able to tolerate short distances with rollator due to pain and is unsteady given extent of antalgic gait pattern, pain  decreases mobility as movement exacerbates patient's pain      MAT EVALUATION:  Neuro-Muscular Status: (Tone, Reflexive, Responses, etc.)     [x]   Intact   []  Spasticity:  []  Hypotonicity  []  Fluctuating  []  Muscle Spasms  []  Poor Righting Reactions/Poor Equilibrium Reactions  []  Primal Reflex(s):    Comments:            COMMENTS:    POSTURE:     Comments:  Pelvis Anterior/Posterior:  []  Neutral   [x]  Posterior  []  Anterior  []  Fixed - No movement [x]  Tendency away from neutral [x]  Flexible [x]  Self-correction []  External correction Obliquity (viewed from front)  []  WFL [x]  R Obliquity []  L Obliquity  []  Fixed - No movement [x]  Tendency away from neutral [x]  Flexible [x]  Self-correction []  External correction Rotation  [x]  WFL []  R anterior []  L anterior  []  Fixed - No movement []  Tendency away from neutral []  Flexible []  Self-correction []  External correction Tonal Influence Pelvis:  [x]  Normal []  Flaccid []  Low tone []  Spasticity []  Dystonia []  Pelvis thrust []  Other:    Trunk Anterior/Posterior:  []  WFL [x]  Thoracic kyphosis [x]  Lumbar lordosis  []  Fixed - No movement [x]  Tendency away from neutral [x]  Flexible [x]  Self-correction []  External correction  [x]  WFL []  Convex to left  []  Convex to right []  S-curve   []  C-curve []  Multiple curves []  Tendency away from neutral [x]  Flexible [x]  Self-correction []  External correction Rotation of shoulders and upper trunk:  []  Neutral [x]  Left-anterior [x]  Right- anterior []  Fixed- no movement [x]  Tendency away from neutral [x]  Flexible [x]  Self correction []  External correction Tonal influence Trunk:  [x]  Normal []  Flaccid []  Low tone []  Spasticity []  Dystonia []  Other:   Head & Neck  [x]  Functional []  Flexed    []  Extended []  Rotated right  []  Rotated left []  Laterally flexed right []   Laterally flexed left []  Cervical hyperextension   [x]  Good head control []   Adequate head control []  Limited head control []  Absent head control Describe tone/movement of head and neck: WNL     Lower Extremity Measurements: LE ROM:  Not formally assessed due to limited tolerance given pain; performed from seated positioning but moderate restriction of R hip and moderately tight hip extensors bilaterally  LE MMT:  MMT Right 04/09/2023 Left 04/09/2023  Hip flexion 2+/5 3+/5  Hip extension    Hip abduction 3-/5 4-/5  Hip adduction 3-/5 4-/5  Knee flexion 4/5 4/5  Knee extension 3-/5 4/5  Ankle dorsiflexion    Ankle plantarflexion     (Blank rows = not tested)  Hip positions:  [x]  Neutral   []  Abducted   []  Adducted  []  Subluxed   []  Dislocated   []  Fixed   []  Tendency away from neutral [x]  Flexible [x]  Self-correction []  External correction   Hip Windswept:[]  Neutral  [x]  Right    []  Left  []  Subluxed   []  Dislocated   []  Fixed   [x]  Tendency away from neutral [x]  Flexible [x]  Self-correction []  External correction  LE Tone: [x]  Normal []  Low tone []  Spasticity []  Flaccid []  Dystonia []  Rocks/Extends at hip []  Thrust into knee extension []  Pushes legs downward into footrest  Foot positioning: Neutral  LE Edema: Reports swelling in ankles a few months ago but denies any recent changes  FUNCTIONAL OUTCOME MEASURES: TUG: 28.4 sec with rollator (>= 12 seconds = increased risk for falls) 10 Meter Walk Test: 0.43 m/s with rollator (indicated increased risk for falls and more likely to be hospitalized)  UE Measurements:   Shoulder Posture:  Right Tendency towards Left  []   Functional []    []   Elevation []    []   Depression []    [x]   Protraction [x]    []   Retraction []    [x]   Internal rotation [x]    []   External rotation []    []   Subluxed []     UE Tone: [x]  Normal []  Flaccid []  Low tone []  Spasticity  []  Dystonia []  Other:   Wrist/Hand: Handedness: []  Right   []  Left   []  NA: Comments:  Right  Left  [x]   WNL [x]    []    Limitations []    []   Contractures []    []   Fisting []    []   Tremors []    []   Weak grasp []    []   Poor dexterity []    []   Hand movement non functional []    []   Paralysis []         MOBILITY BASE RECOMMENDATIONS and JUSTIFICATION:  MOBILITY BASE  JUSTIFICATION   Manufacturer:   Pride Model:  Go Chair Med                            Color: Black Seat Width:  20" Seat Depth 18"   []  Manual mobility base (continue below)   []  Scooter/POV  [x]  Power mobility base   Number of hours per day spent in above selected mobility base: 4-5 hours  Typical daily mobility base use Schedule: Required from movement from room to room and while baking in the kitchen, may sit in for periods of time during the day and use as transportation to go to doctor's appointments   [x]  is not a safe, functional ambulator  [x]  limitation prevents from completing a MRADL(s) within a reasonable time frame    [  x] limitation places at high risk of morbidity or mortality secondary to  the attempts to perform a    MRADL(s)  []  limitation prevents accomplishing a MRADL(s) entirely  [x]  provide independent mobility  [x]  equipment is a lifetime medical need  [x]  walker or cane inadequate  [x]  any type manual wheelchair      inadequate  [x]  scooter/POV inadequate      []  requires dependent mobility           POWER MOBILITY      []  Scooter/POV    []  can safely operate   []  can safely transfer   []  has adequate trunk stability   []  cannot functionally propel  manual wheelchair    [x]  Power mobility base    [x]  non-ambulatory - safely [x]  cannot functionally propel manual wheelchair   [x]  cannot functionally and safely      operate scooter/POV  [x]  can safely operate power       wheelchair  [x]  home is accessible  [x]  willing to use power wheelchair     Tilt  []  Powered tilt on powered chair  []  Powered tilt on manual chair  []  Manual tilt on manual chair Comments:  []  change position for pressure      []   elief/cannot weight shift   []  change position against      gravitational force on head and      shoulders   []  decrease pain  []  blood pressure management   []  control autonomic dysreflexia  []  decrease respiratory distress  []  management of spasticity  []  management of low tone  []  facilitate postural control   []  rest periods   []  control edema  []  increase sitting tolerance   []  aid with transfers     Recline   []  Power recline on power chair  []  Manual recline on manual chair  Comments:    []  intermittent catheterization  []  manage spasticity  []  accommodate femur to back angle  []  change position for pressure relief/cannot weight shift rhigh risk of pressure sore development  []  tilt alone does not accomplish     effective pressure relief, maximum pressure relief achieved at -      _______ degrees tilt   _______ degrees recline   []  difficult to transfer to and from bed []  rest periods and sleeping in chair  []  repositioning for transfers  []  bring to full recline for ADL care  []  clothing/diaper changes in chair  []  gravity PEG tube feeding  []  head positioning  []  decrease pain  []  blood pressure management   []  control autonomic dysreflexia  []  decrease respiratory distress  []  user on ventilator     Elevator on mobility base  []  Power wheelchair  []  Scooter  []  increase Indep in transfers   []  increase Indep in ADLs    []  bathroom function and safety  []  kitchen/cooking function and safety  []  shopping  []  raise height for communication at standing level  []  raise height for eye contact which reduces cervical neck strain and pain  []  drive at raised height for safety and navigating crowds  []  Other:   []  Vertical position system  (anterior tilt)     (Drive locks-out)    []  Stand       (Drive enabled)  []  independent weight bearing  []  decrease joint contractures  []  decrease/manage spasticity  []  decrease/manage spasms  []  pressure distribution away  from  scapula, sacrum, coccyx, and ischial tuberosity   increase digestion and elimination    access to counters and cabinets   increase reach   increase interaction with others at eye level, reduces neck strain   increase performance of       MRADL(s)      Power elevating legrest     Center mount (Single) 85-170 degrees        Standard (Pair) 100-170 degrees   position legs at 90 degrees, not available with std power ELR   center mount tucks into chair to decrease turning radius in home, not available with std power ELR   provide change in position for LE   elevate legs during recline     maintain placement of feet on      footplate   decrease edema   improve circulation   actuator needed to elevate legrest   actuator needed to articulate legrest preventing knees from flexing   Increase ground clearance over      curbs    STD (pair) independently                     elevate legrest   POWER WHEELCHAIR CONTROLS      Controls/input device   Expandable   Non-expandable   Proportional   Right Hand  Left Hand   Non-proportional/switches/head-array   Electrical/proximity           Mechanical      Manufacturer:___________________   Type:___Joystick_____________________  provides access for controlling wheelchair   programming for accurate control   progressive disease/changing condition   required for alternative drive      controls        lacks motor control to operate  proportional drive control   unable to understand proportional controls   limited movement/strength   extraneous movement / tremors / ataxic / spastic        Upgraded electronics controller/harness     Single power (tilt or recline)    Expandable     Non-expandable plus    Multi-power (tilt, recline, power legrest, power seat lift, vertical positioning system, stand)   allows input device to communicate with drive motors    harness provides necessary connections between the controller, input device, and seat functions      needed in order to operate power seat functions through joystick/ input device   required for alternative drive controls      Enhanced display   required to connect all alternative drive controls    required for upgraded joystick      (lite-throw, heavy duty, micro)   Allows user to see in which mode and drive the wheelchair is set; necessary for alternate controls        Upgraded tracking electronics   correct tracking when on uneven surfaces makes switch driving more efficient and less fatiguing   increase safety when driving   increase ability to traverse thresholds     Safety / reset / mode switches     Type:     Used to change modes and stop the wheelchair when driving      Mount for joystick / input device/switches   swing away for access or transfers    attaches joystick / input device / switches to wheelchair    provides for consistent access   midline for optimal placement     Attendant controlled joystick plus     mount   safety   long distance driving    operation of seat functions  []  compliance with transportation regulations    [x]  Battery  [x]  required to power (power assist / scooter/ power wc / other):   []  Power inverter (24V to 12V)  []  required for ventilator / respiratory equipment / other:     CHAIR OPTIONS MANUAL & POWER      Armrests   [x]  adjustable height []  removable  []  swing away []  fixed  []  flip back  []  reclining  [x]  full length pads []  desk []  tube arms []  gel pads  [x]  provide support with elbow at 90    [x]  remove/flip back/swing away for  transfers  [x]  provide support and positioning of upper body    []  allow to come closer to table top  []  remove for access to tables  []  provide support for w/c tray  []  change of height/angles for       variable activities   []  Elbow support / Elbow stop  []   keep elbow positioned on arm pad  []  keep arms from falling off arm pad  during tilt and/or recline   Upper Extremity Support  []  Arm trough  []   R  []   L  Style:  []  swivel mount []  fixed mount   []  posterior hand support  []   tray  []  full tray  []  joystick cut out  []   R  []   L  Style:  []  decrease gravitational pull on      shoulders  []  provide support to increase UE  function  []  provide hand support in natural    position  []  position flaccid UE  []  decrease subluxation    []  decrease edema       []  manage spasticity   []  provide midline positioning  []  provide work surface  []  placement for AAC/ Computer/ EADL       Hangers/ Legrests   []  ______ degree  []  Elevating []  articulating  []  swing away []  fixed []  lift off  []  heavy duty []  adjustable knee angle  []  adjustable calf panel   []  longer extension tube              []  provide LE support  []  maintain placement of feet on      footplate   []  accommodate lower leg length  []  accommodate to hamstring       tightness  []  enable transfers  []  provide change in position for LE's  []  elevate legs during recline    []  decrease edema  []  durability      Foot support   [x]  footplate []  R []  L []  flip up           [x]  Depth adjustable   [x]  angle adjustable  []  foot board/one piece    [x]  provide foot support  [x]  accommodate to ankle ROM  []  allow foot to go under wheelchair base  [x]  enable transfers     []  Shoe holders  []  position foot    []  decrease / manage spasticity  []  control position of LE  []  stability    []  safety     []  Ankle strap/heel      loops  []  support foot on foot support  []  decrease extraneous movement  []  provide input to heel   []  protect foot     []  Amputee adapter []  R  []  L     Style:  Size:  []  Provide support for stump/residual extremity    []  Transportation tie-down  []  to provide crash tested tie-down brackets    []  Crutch/cane holder    []  O2 holder    []  IV  hanger   []  Ventilator tray/mount    []  stabilize accessory on wheelchair       Component  Justification     []  Seat cushion      []  accommodate impaired sensation  []  decubitus ulcers present or history  []  unable to shift weight  []  increase pressure distribution  []  prevent pelvic extension  []  custom required "off-the-shelf"    seat cushion will not accommodate deformity  []  stabilize/promote pelvis alignment  []  stabilize/promote femur alignment  []  accommodate obliquity  []  accommodate multiple deformity  []  incontinent/accidents  []  low maintenance     []  seat mounts                 []  fixed []  removable  []  attach seat platform/cushion to wheelchair frame    []  Seat wedge    []  provide increased aggressiveness of seat shape to decrease sliding  down in the seat  []  accommodate ROM        []  Cover replacement   []  protect back or seat cushion  []  incontinent/accidents    []  Solid seat / insert    []  support cushion to prevent      hammocking  []  allows attachment of cushion to mobility base    []  Lateral pelvic/thigh/hip     support (Guides)     []  decrease abduction  []  accommodate pelvis  []  position upper legs  []  accommodate spasticity  []  removable for transfers     []  Lateral pelvic/thigh      supports mounts  []  fixed   []  swing-away   []  removable  []  mounts lateral pelvic/thigh supports     []  mounts lateral pelvic/thigh supports swing-away or removable for transfers    []  Medial thigh support ([place])  [] decrease adduction  [] accommodate ROM  []  remove for transfers   []  alignment      []  Medial thigh   []  fixed      support mounts      []  swing-away   []  removable  []  mounts medial thigh supports   []  Mounts medial supports swing- away or removable for transfers       Component  Justification   []  Back       []  provide posterior trunk support []  facilitate tone  []  provide lumbar/sacral support []  accommodate deformity  []  support trunk in midline    []  custom required "off-the-shelf" back support will not accommodate deformity   []  provide lateral trunk support []  accommodate or decrease tone            []  Back mounts  []  fixed  []  removable  []  attach back rest/cushion to wheelchair frame   []  Lateral trunk      supports  []  R []  L  []  decrease lateral trunk leaning  []  accommodate asymmetry    []  contour for increased contact  []  safety    []  control of tone    []  Lateral trunk      supports mounts  []  fixed  []  swing-away   []  removable  []  mounts lateral trunk supports     []  Mounts lateral trunk supports swing-away or removable for transfers   []  Anterior chest  strap, vest     []  decrease forward movement of shoulder  []  decrease forward movement of trunk  []  safety/stability  []  added abdominal support  []  trunk alignment  []  assistance with shoulder control   []  decrease shoulder elevation    []  Headrest      []  provide posterior head support  []  provide posterior neck support  []  provide lateral head support  []  provide anterior head support  []  support during tilt and recline  []  improve feeding     []  improve respiration  []  placement of switches  []  safety    []  accommodate ROM   []  accommodate tone  []  improve visual orientation   []  Headrest           []  fixed []  removable []  flip down      Mounting hardware   []  swing-away laterals/switches  []  mount headrest   []  mounts headrest flip down or  removable for transfers  []  mount headrest swing-away laterals   []  mount switches     []  Neck Support    []  decrease neck rotation  []  decrease forward neck flexion   Pelvic Positioner    [x]  std hip belt          []  padded hip belt  []  dual pull hip belt  []  four point hip belt  []  stabilize tone  [x]  decrease falling out of chair  []  prevent excessive extension  []  special pull angle to control      rotation  []  pad for protection over boney   prominence  []  promote comfort    []  Essential needs         bag/pouch   []  medicines []  special food rorthotics []  clothing changes  []  diapers  []  catheter/hygiene []  ostomy supplies   The above equipment has a life- long use expectancy.  Growth and changes in medical and/or functional conditions would be the exceptions.   SUMMARY:  Why mobility device was selected; include why a lower level device is not appropriate:   ASSESSMENT:  CLINICAL IMPRESSION: Patient is a 62 y.o. female who was seen today for physical therapy evaluation and treatment for a power wheelchair with past medical history of spinal stenosis, pre-diabetes and morbid obesity, right hip osteoarthritis. Patient    OBJECTIVE IMPAIRMENTS Abnormal gait, decreased activity tolerance, decreased balance, decreased mobility, difficulty walking, decreased ROM, decreased strength, impaired UE functional use, obesity, and pain.   ACTIVITY LIMITATIONS carrying, lifting, bending, standing, squatting, stairs, transfers, dressing, and caring for others  PARTICIPATION LIMITATIONS: meal prep, community activity, occupation, and yard work  PERSONAL FACTORS Social background and 3+ comorbidities: see above  are also affecting patient's functional outcome.   CLINICAL DECISION MAKING: Stable/uncomplicated  EVALUATION COMPLEXITY: Low          GOALS: One time visit. No goals established.   PLAN: PT FREQUENCY: one time visit   Carmelia Bake, PT, DPT 04/09/2023, 5:07 PM    I concur with the above findings and recommendations of the therapist:  Physician name printed:         Physician's signature:      Date:

## 2023-04-09 NOTE — Telephone Encounter (Signed)
Called pt 1X and left vm. Dr Blackman has an emergency and will not be in office tomorrow ans pt need to reschedule for next available appt with Dr Blackman or PA Clark. Pt choice. 

## 2023-04-10 ENCOUNTER — Ambulatory Visit: Payer: Medicaid Other | Admitting: Orthopaedic Surgery

## 2023-04-10 ENCOUNTER — Telehealth: Payer: Self-pay | Admitting: Orthopaedic Surgery

## 2023-04-10 NOTE — Telephone Encounter (Signed)
Called pt yesterday and today to reschedule with Dr Magnus Ivan ot her choice PA Bronson Curb. Magnus Ivan had an emergency and will not be in office. Let detailed message both message that Magnus Ivan will not be in office need to reschedule. Please reschedule pt when she calls.

## 2023-05-02 ENCOUNTER — Ambulatory Visit: Payer: Medicaid Other | Admitting: Orthopaedic Surgery

## 2023-06-03 ENCOUNTER — Ambulatory Visit (INDEPENDENT_AMBULATORY_CARE_PROVIDER_SITE_OTHER): Payer: Medicaid Other | Admitting: Orthopaedic Surgery

## 2023-06-03 VITALS — Ht 65.0 in | Wt 255.0 lb

## 2023-06-03 DIAGNOSIS — M25551 Pain in right hip: Secondary | ICD-10-CM | POA: Diagnosis not present

## 2023-06-03 DIAGNOSIS — M1611 Unilateral primary osteoarthritis, right hip: Secondary | ICD-10-CM

## 2023-06-03 NOTE — Progress Notes (Signed)
The patient is a 62 year old female that have actually seen before.  She has debilitating end-stage arthritis of her right hip that was seen on plain films even in 2019 and last year on a MRI.  She does ambulate with a rolling walker.  She does get injections in her back.  She denies being a diabetic.  Her BMI today is 42.43.  On exam she has a very large soft tissue envelope around her right hip that precludes her from having an anterior hip replacement from my standpoint thus far.  I would not be comfortable going to the soft tissue given the history of hip infections that I have had on similar patient as well as wound breakdowns.  I can certainly see her back in 3 months for repeat weight and BMI calculation.  We can have an AP pelvis and lateral of her right hip at that visit but I would like that to be supine with her pulling her abdomen out of the way so we can get a good look at her hip.

## 2023-09-02 ENCOUNTER — Ambulatory Visit: Payer: Medicaid Other | Admitting: Orthopaedic Surgery

## 2023-09-18 ENCOUNTER — Ambulatory Visit: Payer: Medicaid Other | Admitting: Orthopaedic Surgery

## 2023-10-09 ENCOUNTER — Ambulatory Visit: Payer: Medicaid Other | Admitting: Orthopaedic Surgery

## 2023-11-20 ENCOUNTER — Ambulatory Visit: Payer: Medicaid Other | Admitting: Orthopaedic Surgery

## 2023-12-19 ENCOUNTER — Ambulatory Visit: Payer: Medicaid Other | Admitting: Orthopaedic Surgery

## 2024-01-22 ENCOUNTER — Ambulatory Visit: Payer: Medicaid Other | Admitting: Orthopaedic Surgery

## 2024-02-10 ENCOUNTER — Ambulatory Visit: Payer: Medicaid Other | Admitting: Orthopaedic Surgery

## 2024-03-09 ENCOUNTER — Ambulatory Visit: Payer: Medicaid Other | Admitting: Orthopaedic Surgery

## 2024-03-30 ENCOUNTER — Ambulatory Visit: Admitting: Orthopaedic Surgery

## 2024-05-25 ENCOUNTER — Ambulatory Visit: Admitting: Orthopaedic Surgery

## 2024-06-15 ENCOUNTER — Ambulatory Visit: Admitting: Orthopaedic Surgery

## 2024-07-08 ENCOUNTER — Ambulatory Visit (INDEPENDENT_AMBULATORY_CARE_PROVIDER_SITE_OTHER): Admitting: Orthopaedic Surgery

## 2024-07-08 ENCOUNTER — Other Ambulatory Visit (INDEPENDENT_AMBULATORY_CARE_PROVIDER_SITE_OTHER): Payer: Self-pay

## 2024-07-08 VITALS — Ht 64.0 in | Wt 264.0 lb

## 2024-07-08 DIAGNOSIS — M1611 Unilateral primary osteoarthritis, right hip: Secondary | ICD-10-CM

## 2024-07-08 NOTE — Progress Notes (Signed)
 The patient is a 63 year old female well-known to us .  She has debilitating right hip pain and known arthritis of her right hip.  She does ambulate with a rolling walker.  Today she comes in for repeat weight and BMI calculation.  The last we saw her was in June of last year and her BMI was 42.  Today her BMI is up to 45.32.  On exam she has limited range of motion of her right hip and is very stiff and painful.  She has a very large soft tissue envelope around her right hip and her pannus area as well which makes it too difficult to proceed with hip replacement surgery due to her weight.  An AP pelvis and lateral right hip shows bone-on-bone wear of the right hip.  She would have to lose significant weight before we would consider any type of joint replacement surgery given the obesity and large soft tissue envelope around her right hip.  Until she loses more weight follow-up can be as needed.

## 2024-08-27 ENCOUNTER — Other Ambulatory Visit: Payer: Self-pay | Admitting: Neurosurgery

## 2024-08-27 ENCOUNTER — Encounter: Payer: Self-pay | Admitting: Neurosurgery

## 2024-08-27 DIAGNOSIS — M48062 Spinal stenosis, lumbar region with neurogenic claudication: Secondary | ICD-10-CM

## 2024-10-19 ENCOUNTER — Encounter: Payer: Self-pay | Admitting: Radiology

## 2025-01-11 ENCOUNTER — Ambulatory Visit: Admitting: Orthopaedic Surgery
